# Patient Record
Sex: Female | Born: 1945 | Race: White | Smoking: Never smoker
Health system: Southern US, Community
[De-identification: ages and names within clinical notes are randomized; demographics above are authoritative.]

## PROBLEM LIST (undated history)

## (undated) DIAGNOSIS — J45909 Unspecified asthma, uncomplicated: Secondary | ICD-10-CM

## (undated) DIAGNOSIS — F32A Depression, unspecified: Secondary | ICD-10-CM

## (undated) DIAGNOSIS — M1990 Unspecified osteoarthritis, unspecified site: Secondary | ICD-10-CM

## (undated) DIAGNOSIS — E7800 Pure hypercholesterolemia, unspecified: Secondary | ICD-10-CM

## (undated) DIAGNOSIS — F329 Major depressive disorder, single episode, unspecified: Secondary | ICD-10-CM

## (undated) HISTORY — PX: CHOLECYSTECTOMY: SHX55

---

## 2015-03-27 ENCOUNTER — Emergency Department: Payer: Medicare Other

## 2015-03-27 ENCOUNTER — Emergency Department
Admission: EM | Admit: 2015-03-27 | Discharge: 2015-03-27 | Disposition: A | Discharge status: Home / Self Care | Payer: Medicare Other | Attending: Emergency Medicine | Admitting: Emergency Medicine

## 2015-03-27 DIAGNOSIS — J4521 Mild intermittent asthma with (acute) exacerbation: Secondary | ICD-10-CM | POA: Insufficient documentation

## 2015-03-27 DIAGNOSIS — R0602 Shortness of breath: Secondary | ICD-10-CM | POA: Diagnosis present

## 2015-03-27 MED ORDER — ALBUTEROL SULFATE HFA 108 (90 BASE) MCG/ACT IN AERS: 2.0000 | INHALATION_SPRAY | RESPIRATORY_TRACT | Status: DC | PRN

## 2015-03-27 MED ORDER — IPRATROPIUM-ALBUTEROL 0.5-2.5 (3) MG/3ML IN SOLN
3.0000 mL | Freq: Once | RESPIRATORY_TRACT | Status: AC
  Administered 2015-03-27: 3 mL via RESPIRATORY_TRACT

## 2015-03-27 MED ORDER — PREDNISONE 10 MG PO TABS: 10.0000 mg | ORAL_TABLET | Freq: Two times a day (BID) | ORAL | Status: DC

## 2015-03-27 MED ORDER — DEXAMETHASONE SODIUM PHOSPHATE 10 MG/ML IJ SOLN
10.0000 mg | Freq: Once | INTRAMUSCULAR | Status: AC
  Administered 2015-03-27: 10 mg via INTRAMUSCULAR
  Filled 2015-03-27: qty 1

## 2015-03-27 MED ORDER — IPRATROPIUM-ALBUTEROL 0.5-2.5 (3) MG/3ML IN SOLN
3.0000 mL | Freq: Once | RESPIRATORY_TRACT | Status: AC
Start: 2015-03-27 — End: 2015-03-27
  Administered 2015-03-27: 3 mL via RESPIRATORY_TRACT

## 2015-03-27 MED ORDER — IPRATROPIUM-ALBUTEROL 0.5-2.5 (3) MG/3ML IN SOLN
RESPIRATORY_TRACT | Status: AC
  Administered 2015-03-27: 3 mL via RESPIRATORY_TRACT
  Filled 2015-03-27: qty 3

## 2015-03-27 NOTE — ED Notes (Signed)
Patient presents to ED with son from home with C/O ShOB with hx of asthma. Patient states she used singulair and albuterol at home with no resolution of symptoms.

## 2015-03-27 NOTE — Discharge Instructions (Signed)
Asthma, Acute Bronchospasm °Acute bronchospasm caused by asthma is also referred to as an asthma attack. Bronchospasm means your air passages become narrowed. The narrowing is caused by inflammation and tightening of the muscles in the air tubes (bronchi) in your lungs. This can make it hard to breathe or cause you to wheeze and cough. °CAUSES °Possible triggers are: °· Animal dander from the skin, hair, or feathers of animals. °· Dust mites contained in house dust. °· Cockroaches. °· Pollen from trees or grass. °· Mold. °· Cigarette or tobacco smoke. °· Air pollutants such as dust, household cleaners, hair sprays, aerosol sprays, paint fumes, strong chemicals, or strong odors. °· Cold air or weather changes. Cold air may trigger inflammation. Winds increase molds and pollens in the air. °· Strong emotions such as crying or laughing hard. °· Stress. °· Certain medicines such as aspirin or beta-blockers. °· Sulfites in foods and drinks, such as dried fruits and wine. °· Infections or inflammatory conditions, such as a flu, cold, or inflammation of the nasal membranes (rhinitis). °· Gastroesophageal reflux disease (GERD). GERD is a condition where stomach acid backs up into your esophagus. °· Exercise or strenuous activity. °SIGNS AND SYMPTOMS  °· Wheezing. °· Excessive coughing, particularly at night. °· Chest tightness. °· Shortness of breath. °DIAGNOSIS  °Your health care provider will ask you about your medical history and perform a physical exam. A chest X-ray or blood testing may be performed to look for other causes of your symptoms or other conditions that may have triggered your asthma attack.  °TREATMENT  °Treatment is aimed at reducing inflammation and opening up the airways in your lungs.  Most asthma attacks are treated with inhaled medicines. These include quick relief or rescue medicines (such as bronchodilators) and controller medicines (such as inhaled corticosteroids). These medicines are sometimes  given through an inhaler or a nebulizer. Systemic steroid medicine taken by mouth or given through an IV tube also can be used to reduce the inflammation when an attack is moderate or severe. Antibiotic medicines are only used if a bacterial infection is present.  °HOME CARE INSTRUCTIONS  °· Rest. °· Drink plenty of liquids. This helps the mucus to remain thin and be easily coughed up. Only use caffeine in moderation and do not use alcohol until you have recovered from your illness. °· Do not smoke. Avoid being exposed to secondhand smoke. °· You play a critical role in keeping yourself in good health. Avoid exposure to things that cause you to wheeze or to have breathing problems. °· Keep your medicines up-to-date and available. Carefully follow your health care provider's treatment plan. °· Take your medicine exactly as prescribed. °· When pollen or pollution is bad, keep windows closed and use an air conditioner or go to places with air conditioning. °· Asthma requires careful medical care. See your health care provider for a follow-up as advised. If you are more than [redacted] weeks pregnant and you were prescribed any new medicines, let your obstetrician know about the visit and how you are doing. Follow up with your health care provider as directed. °· After you have recovered from your asthma attack, make an appointment with your outpatient doctor to talk about ways to reduce the likelihood of future attacks. If you do not have a doctor who manages your asthma, make an appointment with a primary care doctor to discuss your asthma. °SEEK IMMEDIATE MEDICAL CARE IF:  °· You are getting worse. °· You have trouble breathing. If severe, call your local   emergency services (911 in the [place]  You develop chest pain or discomfort.  You are vomiting.  You are not able to keep fluids down.  You are coughing up yellow, green, brown, or bloody sputum.  You have a fever and your symptoms suddenly get worse.  You have  trouble swallowing. MAKE SURE YOU:   Understand these instructions.  Will watch your condition.  Will get help right away if you are not doing well or get worse. Document Released: 11/28/2006 Document Revised: 08/18/2013 Document Reviewed: 02/18/2013 [facility] Patient Information 2015 [place], [place]. This information is not intended to replace advice given to you by your health care provider. Make sure you discuss any questions you have with your health care provider.  Continue your home meds for asthma.  Take the prednisone as directed.  Increase fluid intake and avoid the humidity. Use your rescue inhaler every 15 minutes for acute chest tightness, shortness of breath, and wheezing.

## 2015-03-27 NOTE — ED Notes (Signed)
Pt reports receiving nebulizer in triage but medication not marked off by medical staff. After confirming with staff, pt did receive duoneb while in triage.

## 2015-03-27 NOTE — ED Provider Notes (Signed)
[facility] Emergency Department Provider Note ____________________________________________  Time seen: 2255  I have reviewed the triage vital signs and the nursing notes.  HISTORY  Chief Complaint  Asthma  HPI Veronica Wright is a 69 y.o. female reports to the ED for symptoms related to shortness of breath today. She describes she was at home doing laundry and other household chores, when she began to feel slightly short of breath with some associated chest tightness. She used her Singulair inhaler, and then about an hour later used her albuterol inhaler. Her symptoms continue, but she did not re-dose her albuterol inhaler. She said called her adult son who has presented her to the ED for treatment of her symptoms. She denies any significant cough or congestion, denies any chest pain palpitations or productive cough.  No past medical history on file.  There are no active problems to display for this patient.  No past surgical history on file.  Current Outpatient Rx  Name  Route  Sig  Dispense  Refill  . albuterol (PROVENTIL HFA;VENTOLIN HFA) 108 (90 BASE) MCG/ACT inhaler   Inhalation   Inhale 2 puffs into the lungs every 15 (fifteen) minutes as needed for wheezing or shortness of breath.   1 Inhaler   0   . predniSONE (DELTASONE) 10 MG tablet   Oral   Take 1 tablet (10 mg total) by mouth 2 (two) times daily with a meal.   10 tablet   0    Allergies Review of patient's allergies indicates not on file.  No family history on file.  Social History History  Substance Use Topics  . Smoking status: Not on file  . Smokeless tobacco: Not on file  . Alcohol Use: Not on file   Review of Systems  Constitutional: Negative for fever. Eyes: Negative for visual changes. ENT: Negative for sore throat. Cardiovascular: Negative for chest pain. Respiratory: Positive for shortness of breath. Gastrointestinal: Negative for abdominal pain, vomiting and  diarrhea. Genitourinary: Negative for dysuria. Musculoskeletal: Negative for back pain. Skin: Negative for rash. Neurological: Negative for headaches, focal weakness or numbness. ____________________________________________  PHYSICAL EXAM:  VITAL SIGNS: ED Triage Vitals  Enc Vitals Group     BP 03/27/15 1720 144/76 mmHg     Pulse Rate 03/27/15 1720 55     Resp 03/27/15 1720 20     Temp 03/27/15 1720 97.8 F (36.6 C)     Temp Source 03/27/15 1720 Oral     SpO2 03/27/15 1720 100 %     Weight 03/27/15 1720 180 lb (81.647 kg)     Height 03/27/15 1720 5' (1.524 m)     Head Cir --      Peak Flow --      Pain Score --      Pain Loc --      Pain Edu? --      Excl. in [place]? --    Constitutional: Alert and oriented. Well appearing and in no distress. Vital signs reviewed, saturating at 100% on room air. Eyes: Conjunctivae are normal. PERRL. Normal extraocular movements. ENT   Head: Normocephalic and atraumatic.   Nose: No congestion/rhinnorhea.   Mouth/Throat: Mucous membranes are moist.   Neck: Supple. No thyromegaly. Hematological/Lymphatic/Immunilogical: No cervical lymphadenopathy. Cardiovascular: Normal rate, regular rhythm.  Respiratory: Normal respiratory effort. Mild end-expiratory wheezes noted in the bases. No rales/rhonchi. Gastrointestinal: Soft and nontender. No distention. Musculoskeletal: Nontender with normal range of motion in all extremities.  Neurologic:  Normal gait without ataxia.  Normal speech and language. No gross focal neurologic deficits are appreciated. Skin:  Skin is warm, dry and intact. No rash noted. Psychiatric: Mood and affect are normal. Patient exhibits appropriate insight and judgment. ____________________________________________   RADIOLOGY CXR Negative  I, Delroy Ordway, Dannielle Karvonen, personally viewed and evaluated these images as part of my medical decision making.   ____________________________________________  PROCEDURES  DuoNeb x 2 Decadron 10 mg IM ____________________________________________  INITIAL IMPRESSION / ASSESSMENT AND PLAN / ED COURSE  Patient reports improvement of shortness of breath after second breathing treatment. Will treat for acute bronchospasm due to asthma flare. Radiology results to patient. Education on the use of rescue inhaler. Patient to continue home meds. Prescription for albuterol and Prednisone 10 mg #10 provided.  Follow-up with primary provider or return as needed.  ____________________________________________  FINAL CLINICAL IMPRESSION(S) / ED DIAGNOSES  Final diagnoses:  Asthma exacerbation attacks, mild intermittent     Melvenia Needles, PA-C 03/27/15 2342  Earleen Newport, MD 03/28/15 [phone]

## 2016-08-23 ENCOUNTER — Other Ambulatory Visit: Payer: Self-pay | Admitting: Physician Assistant

## 2016-08-23 DIAGNOSIS — Z1231 Encounter for screening mammogram for malignant neoplasm of breast: Secondary | ICD-10-CM

## 2016-09-19 ENCOUNTER — Ambulatory Visit: Payer: Medicare Other

## 2016-09-28 ENCOUNTER — Encounter (HOSPITAL_COMMUNITY): Payer: Self-pay

## 2016-09-28 ENCOUNTER — Ambulatory Visit
Admission: RE | Admit: 2016-09-28 | Discharge: 2016-09-28 | Disposition: A | Discharge status: Home / Self Care | Payer: Medicare Other | Source: Ambulatory Visit | Attending: Physician Assistant | Admitting: Physician Assistant

## 2016-09-28 DIAGNOSIS — Z1231 Encounter for screening mammogram for malignant neoplasm of breast: Secondary | ICD-10-CM | POA: Insufficient documentation

## 2016-10-10 ENCOUNTER — Other Ambulatory Visit: Payer: Self-pay | Admitting: *Deleted

## 2016-10-10 ENCOUNTER — Inpatient Hospital Stay
Admission: RE | Admit: 2016-10-10 | Discharge: 2016-10-10 | Disposition: A | Discharge status: Home / Self Care | Payer: Self-pay | Source: Ambulatory Visit | Attending: *Deleted | Admitting: *Deleted

## 2016-10-10 DIAGNOSIS — Z1231 Encounter for screening mammogram for malignant neoplasm of breast: Secondary | ICD-10-CM

## 2016-10-22 ENCOUNTER — Other Ambulatory Visit: Payer: Self-pay | Admitting: Gastroenterology

## 2016-10-22 DIAGNOSIS — R1310 Dysphagia, unspecified: Secondary | ICD-10-CM

## 2016-11-02 ENCOUNTER — Ambulatory Visit: Payer: Medicare Other

## 2016-11-07 ENCOUNTER — Ambulatory Visit
Admission: RE | Admit: 2016-11-07 | Discharge: 2016-11-07 | Disposition: A | Discharge status: Home / Self Care | Payer: Medicare Other | Source: Ambulatory Visit | Attending: Gastroenterology | Admitting: Gastroenterology

## 2016-11-07 DIAGNOSIS — R1310 Dysphagia, unspecified: Secondary | ICD-10-CM | POA: Diagnosis not present

## 2016-12-12 ENCOUNTER — Emergency Department
Admission: EM | Admit: 2016-12-12 | Discharge: 2016-12-12 | Discharge status: Against Medical Advice | Payer: Medicare Other | Attending: Emergency Medicine | Admitting: Emergency Medicine

## 2016-12-12 ENCOUNTER — Emergency Department: Payer: Medicare Other

## 2016-12-12 ENCOUNTER — Encounter: Payer: Self-pay | Admitting: *Deleted

## 2016-12-12 DIAGNOSIS — Z79899 Other long term (current) drug therapy: Secondary | ICD-10-CM | POA: Diagnosis not present

## 2016-12-12 DIAGNOSIS — M5416 Radiculopathy, lumbar region: Secondary | ICD-10-CM | POA: Diagnosis not present

## 2016-12-12 DIAGNOSIS — J45909 Unspecified asthma, uncomplicated: Secondary | ICD-10-CM | POA: Insufficient documentation

## 2016-12-12 DIAGNOSIS — M545 Low back pain: Secondary | ICD-10-CM | POA: Diagnosis present

## 2016-12-12 HISTORY — DX: Unspecified asthma, uncomplicated: J45.909

## 2016-12-12 LAB — URINALYSIS, COMPLETE (UACMP) WITH MICROSCOPIC
BACTERIA UA: NONE SEEN
BILIRUBIN URINE: NEGATIVE
Glucose, UA: NEGATIVE mg/dL
Hgb urine dipstick: NEGATIVE
Ketones, ur: NEGATIVE mg/dL
Nitrite: NEGATIVE
Protein, ur: 30 mg/dL — AB
Specific Gravity, Urine: 1.024 (ref 1.005–1.030)
pH: 5 (ref 5.0–8.0)

## 2016-12-12 MED ORDER — FENTANYL CITRATE (PF) 100 MCG/2ML IJ SOLN
50.0000 ug | Freq: Once | INTRAMUSCULAR | Status: AC
  Administered 2016-12-12: 50 ug via INTRAVENOUS
  Filled 2016-12-12: qty 2

## 2016-12-12 MED ORDER — ORPHENADRINE CITRATE 30 MG/ML IJ SOLN
60.0000 mg | Freq: Once | INTRAMUSCULAR | Status: AC
  Administered 2016-12-12: 60 mg via INTRAVENOUS
  Filled 2016-12-12: qty 2

## 2016-12-12 MED ORDER — DEXAMETHASONE SODIUM PHOSPHATE 10 MG/ML IJ SOLN
10.0000 mg | Freq: Once | INTRAMUSCULAR | Status: AC
  Administered 2016-12-12: 10 mg via INTRAMUSCULAR
  Filled 2016-12-12: qty 1

## 2016-12-12 NOTE — ED Provider Notes (Signed)
[facility] Emergency Department Provider Note ____________________________________________  Time seen: Approximately 4:57 PM  I have reviewed the triage vital signs and the nursing notes.   HISTORY  Chief Complaint Back Pain    HPI Veronica Wright is a 71 y.o. female who presents to the emergency department for evaluation of lower back pain for the past month. She was evaluated by "Dr. Ouida Sills" at [facility] and was advised she has a "slipped disc." She is going through physical therapy. Her pain is much worse today and is now in the left anterior leg. No relief with muscle relaxer. She denies loss of bowel or bladder function.   Past Medical History:  Diagnosis Date  . Asthma     There are no active problems to display for this patient.   History reviewed. No pertinent surgical history.  Prior to Admission medications   Medication Sig Start Date End Date Taking? Authorizing Provider  albuterol (PROVENTIL HFA;VENTOLIN HFA) 108 (90 BASE) MCG/ACT inhaler Inhale 2 puffs into the lungs every 15 (fifteen) minutes as needed for wheezing or shortness of breath. 03/27/15   Jenise V Bacon Menshew, PA-C  predniSONE (DELTASONE) 10 MG tablet Take 1 tablet (10 mg total) by mouth 2 (two) times daily with a meal. 03/27/15   Jenise V Bacon Menshew, PA-C    Allergies Patient has no known allergies.  Family History  Problem Relation Age of Onset  . Breast cancer Sister 84    Social History Social History  Substance Use Topics  . Smoking status: Never Smoker  . Smokeless tobacco: Never Used  . Alcohol use No    Review of Systems Constitutional: No recent illness. Cardiovascular: Denies chest pain or palpitations. Respiratory: Denies shortness of breath. Musculoskeletal: Pain in left lower back. Skin: Negative for rash, wound, lesion. Neurological: Negative for focal weakness or numbness.  ____________________________________________   PHYSICAL  EXAM:  VITAL SIGNS: ED Triage Vitals  Enc Vitals Group     BP 12/12/16 1649 137/73     Pulse Rate 12/12/16 1649 (!) 56     Resp 12/12/16 1649 18     Temp 12/12/16 1649 98.2 F (36.8 C)     Temp Source 12/12/16 1649 Oral     SpO2 12/12/16 1649 98 %     Weight 12/12/16 1647 190 lb (86.2 kg)     Height 12/12/16 1647 5' (1.524 m)     Head Circumference --      Peak Flow --      Pain Score 12/12/16 1647 10     Pain Loc --      Pain Edu? --      Excl. in [place]? --     Constitutional: Alert and oriented. Well appearing and in no acute distress. Eyes: Conjunctivae are normal. EOMI. Head: Atraumatic. Neck: No stridor.  Respiratory: Normal respiratory effort.   Musculoskeletal: Antalgic gait; No focal bony tenderness or stepoff over the lumbar spine; exam limited due to patient's inability lie or sit due to pain.  Neurologic:  Normal speech and language. No gross focal neurologic deficits are appreciated. Speech is normal. No gait instability. Skin:  Skin is warm, dry and intact. Atraumatic. Psychiatric: Mood and affect are normal. Speech and behavior are normal.  ____________________________________________   LABS (all labs ordered are listed, but only abnormal results are displayed)  Labs Reviewed  URINALYSIS, COMPLETE (UACMP) WITH MICROSCOPIC - Abnormal; Notable for the following:       Result Value   Color, Urine  YELLOW (*)    APPearance HAZY (*)    Protein, ur 30 (*)    Leukocytes, UA LARGE (*)    Squamous Epithelial / LPF 0-5 (*)    Non Squamous Epithelial 0-5 (*)    All other components within normal limits   ____________________________________________  RADIOLOGY  Patient refused MR of Lumbar spine. ____________________________________________   PROCEDURES  Procedure(s) performed: None  ____________________________________________   INITIAL IMPRESSION / ASSESSMENT AND PLAN / ED COURSE  71 year old female presenting to the emergency department for evaluation  of lower back and left leg pain. Pain has been gradually worsening over the past 24 hours without new injury. She reports that muscle relaxers have not worked in the past, but I would like to try an injection of Norflex and reassess. We also discussed MRI. Patient states that she is claustrophobic. She was advised that I will order antianxiety medications prior to testing and that she would most likely go in feet first.    ---------------------------------------- 6:37 PM on 12/12/2016 ----------------------------------------  Awaiting MR technician to arrive. Patient states that her leg pain has improved, but her back pain is unchanged after the Norflex and decadron. Fentanyl ordered.   ----------------------------------------- 7:58 PM on 12/12/2016 -----------------------------------------  After the patient went to MRI, she refused to have the test completed. She was offered anti-anxiety medications, but continued to refuse. The testing was to be completed feetfirst verses headfirst and the patient was aware of that, yet continued to refuse. She will leave [facility]. She was warned that her condition may worsen and lead to long-term medical complications and potentially death. She was strongly advised to call her orthopedist tomorrow. She was advised to return to the emergency department for symptoms that change or worsen if she is unable to see the orthopedist.  Pertinent labs & imaging results that were available during my care of the patient were reviewed by me and considered in my medical decision making (see chart for details).  _________________________________________   FINAL CLINICAL IMPRESSION(S) / ED DIAGNOSES  Final diagnoses:  Lumbar radiculopathy, acute    Discharge Medication List as of 12/12/2016  7:57 PM      If controlled substance prescribed during this visit, 12 month history viewed on the [employer] prior to issuing an initial prescription for Schedule II  or III opiod.    Victorino Dike, FNP 12/12/16 2055    Nance Pear, MD 12/12/16 2220

## 2016-12-12 NOTE — ED Triage Notes (Signed)
States she was diagnosed with a slipped disc in her lower back 1 month ago, states she has been going to PT but states increased back pain for the past day or so, ambulatory, denies any new injury

## 2016-12-12 NOTE — ED Notes (Signed)
Pt signed out ama.  Pt refused mri tonight secondary to claustrophobia and also refused anxiety meds.  Iv removed   Discharge papers given to pt.  Pt out via wheelchair.  Pt alert  Speech clear.

## 2016-12-12 NOTE — Discharge Instructions (Signed)
You are leaving against medical advice and it is your responsibility to make this decision knowing that your symptoms may worsen and lead to long term complications and even death in some cases.  I strongly recommend following up with your orthopedist as soon as possible.  Return to the ER for symptoms that change or worsen if you are unable to schedule an appointment.

## 2016-12-12 NOTE — ED Notes (Signed)
Decadron and Noriflex given IM.  [place] FNP aware.  Iv started and meds given iv. [name]

## 2016-12-12 NOTE — ED Notes (Signed)
Pt reports she has a slipped disc in her lower back/  Pt had PT on Monday and now has increased pain in lower back and left leg.  No recent injury to back  No urinary sx.  Pt alert   Speech clear.

## 2016-12-13 ENCOUNTER — Other Ambulatory Visit: Payer: Self-pay | Admitting: Physician Assistant

## 2016-12-13 DIAGNOSIS — S39012A Strain of muscle, fascia and tendon of lower back, initial encounter: Secondary | ICD-10-CM

## 2016-12-14 ENCOUNTER — Ambulatory Visit
Admission: RE | Admit: 2016-12-14 | Discharge: 2016-12-14 | Disposition: A | Discharge status: Home / Self Care | Payer: Medicare Other | Source: Ambulatory Visit | Attending: Physician Assistant | Admitting: Physician Assistant

## 2016-12-14 DIAGNOSIS — M5137 Other intervertebral disc degeneration, lumbosacral region: Secondary | ICD-10-CM | POA: Insufficient documentation

## 2016-12-14 DIAGNOSIS — M47816 Spondylosis without myelopathy or radiculopathy, lumbar region: Secondary | ICD-10-CM | POA: Insufficient documentation

## 2016-12-14 DIAGNOSIS — M5136 Other intervertebral disc degeneration, lumbar region: Secondary | ICD-10-CM | POA: Insufficient documentation

## 2016-12-14 DIAGNOSIS — M5416 Radiculopathy, lumbar region: Secondary | ICD-10-CM | POA: Diagnosis present

## 2016-12-14 DIAGNOSIS — S39012A Strain of muscle, fascia and tendon of lower back, initial encounter: Secondary | ICD-10-CM

## 2017-02-11 ENCOUNTER — Encounter: Payer: Self-pay | Admitting: *Deleted

## 2017-02-12 ENCOUNTER — Encounter
Admission: RE | Disposition: A | Discharge status: Home / Self Care | Payer: Self-pay | Source: Ambulatory Visit | Attending: Gastroenterology

## 2017-02-12 ENCOUNTER — Ambulatory Visit: Payer: Medicare Other | Admitting: Certified Registered Nurse Anesthetist

## 2017-02-12 ENCOUNTER — Ambulatory Visit
Admission: RE | Admit: 2017-02-12 | Discharge: 2017-02-12 | Disposition: A | Discharge status: Home / Self Care | Payer: Medicare Other | Source: Ambulatory Visit | Attending: Gastroenterology | Admitting: Gastroenterology

## 2017-02-12 ENCOUNTER — Encounter: Payer: Self-pay | Admitting: *Deleted

## 2017-02-12 DIAGNOSIS — J45909 Unspecified asthma, uncomplicated: Secondary | ICD-10-CM | POA: Diagnosis not present

## 2017-02-12 DIAGNOSIS — K219 Gastro-esophageal reflux disease without esophagitis: Secondary | ICD-10-CM | POA: Insufficient documentation

## 2017-02-12 DIAGNOSIS — R7303 Prediabetes: Secondary | ICD-10-CM | POA: Insufficient documentation

## 2017-02-12 DIAGNOSIS — F329 Major depressive disorder, single episode, unspecified: Secondary | ICD-10-CM | POA: Insufficient documentation

## 2017-02-12 DIAGNOSIS — R1310 Dysphagia, unspecified: Secondary | ICD-10-CM | POA: Diagnosis not present

## 2017-02-12 DIAGNOSIS — D122 Benign neoplasm of ascending colon: Secondary | ICD-10-CM | POA: Diagnosis not present

## 2017-02-12 DIAGNOSIS — Z1211 Encounter for screening for malignant neoplasm of colon: Secondary | ICD-10-CM | POA: Insufficient documentation

## 2017-02-12 DIAGNOSIS — Z79899 Other long term (current) drug therapy: Secondary | ICD-10-CM | POA: Insufficient documentation

## 2017-02-12 DIAGNOSIS — K5750 Diverticulosis of both small and large intestine without perforation or abscess without bleeding: Secondary | ICD-10-CM | POA: Diagnosis not present

## 2017-02-12 DIAGNOSIS — D120 Benign neoplasm of cecum: Secondary | ICD-10-CM | POA: Insufficient documentation

## 2017-02-12 DIAGNOSIS — K3189 Other diseases of stomach and duodenum: Secondary | ICD-10-CM | POA: Insufficient documentation

## 2017-02-12 DIAGNOSIS — R21 Rash and other nonspecific skin eruption: Secondary | ICD-10-CM | POA: Insufficient documentation

## 2017-02-12 DIAGNOSIS — Z880 Allergy status to penicillin: Secondary | ICD-10-CM | POA: Diagnosis not present

## 2017-02-12 DIAGNOSIS — M1990 Unspecified osteoarthritis, unspecified site: Secondary | ICD-10-CM | POA: Insufficient documentation

## 2017-02-12 DIAGNOSIS — E7800 Pure hypercholesterolemia, unspecified: Secondary | ICD-10-CM | POA: Insufficient documentation

## 2017-02-12 HISTORY — DX: Pure hypercholesterolemia, unspecified: E78.00

## 2017-02-12 HISTORY — DX: Major depressive disorder, single episode, unspecified: F32.9

## 2017-02-12 HISTORY — PX: COLONOSCOPY WITH PROPOFOL: SHX5780

## 2017-02-12 HISTORY — DX: Unspecified osteoarthritis, unspecified site: M19.90

## 2017-02-12 HISTORY — PX: ESOPHAGOGASTRODUODENOSCOPY (EGD) WITH PROPOFOL: SHX5813

## 2017-02-12 HISTORY — DX: Depression, unspecified: F32.A

## 2017-02-12 SURGERY — COLONOSCOPY WITH PROPOFOL
Anesthesia: General

## 2017-02-12 MED ORDER — FENTANYL CITRATE (PF) 100 MCG/2ML IJ SOLN
INTRAMUSCULAR | Status: DC | PRN
  Administered 2017-02-12: 50 ug via INTRAVENOUS

## 2017-02-12 MED ORDER — GLYCOPYRROLATE 0.2 MG/ML IJ SOLN
INTRAMUSCULAR | Status: DC | PRN
  Administered 2017-02-12: 0.2 mg via INTRAVENOUS

## 2017-02-12 MED ORDER — PROPOFOL 500 MG/50ML IV EMUL
INTRAVENOUS | Status: AC
  Filled 2017-02-12: qty 50

## 2017-02-12 MED ORDER — LIDOCAINE HCL (CARDIAC) 20 MG/ML IV SOLN
INTRAVENOUS | Status: DC | PRN
  Administered 2017-02-12: 100 mg via INTRAVENOUS

## 2017-02-12 MED ORDER — LIDOCAINE HCL (PF) 2 % IJ SOLN
INTRAMUSCULAR | Status: AC
  Filled 2017-02-12: qty 2

## 2017-02-12 MED ORDER — PROPOFOL 10 MG/ML IV BOLUS
INTRAVENOUS | Status: DC | PRN
  Administered 2017-02-12: 25 mg via INTRAVENOUS
  Administered 2017-02-12: 8.5 mg via INTRAVENOUS
  Administered 2017-02-12: 17 mg via INTRAVENOUS
  Administered 2017-02-12: 25 mg via INTRAVENOUS
  Administered 2017-02-12 (×2): 8.5 mg via INTRAVENOUS

## 2017-02-12 MED ORDER — SODIUM CHLORIDE 0.9 % IV SOLN
INTRAVENOUS | Status: DC
  Administered 2017-02-12: 1000 mL via INTRAVENOUS

## 2017-02-12 MED ORDER — PROPOFOL 500 MG/50ML IV EMUL
INTRAVENOUS | Status: DC | PRN
  Administered 2017-02-12: 120 ug/kg/min via INTRAVENOUS

## 2017-02-12 NOTE — Op Note (Signed)
[facility] Gastroenterology Patient Name: Veronica Wright Procedure Date: 02/12/2017 1:12 PM MRN: [id number] Account #: [id number] Date of Birth: July 19, 1946 Admit Type: Outpatient Age: 71 Room: [facility] ENDO ROOM 3 Gender: Female Note Status: Finalized Procedure:            Colonoscopy Indications:          Screening for colorectal malignant neoplasm Providers:            Lollie Sails, MD Referring MD:         No Local Md, MD (Referring MD) Medicines:            Monitored Anesthesia Care Complications:        No immediate complications. Procedure:            Pre-Anesthesia Assessment:                       - ASA Grade Assessment: III - A patient with severe                        systemic disease.                       After obtaining informed consent, the colonoscope was                        passed under direct vision. Throughout the procedure,                        the patient's blood pressure, pulse, and oxygen                        saturations were monitored continuously. The                        Colonoscope was introduced through the anus and                        advanced to the the cecum, identified by appendiceal                        orifice and ileocecal valve. The patient tolerated the                        procedure well. The colonoscopy was performed with                        moderate difficulty due to poor bowel prep. Successful                        completion of the procedure was aided by lavage. The                        quality of the bowel preparation was fair. Findings:      A 3 mm polyp was found in the cecum. The polyp was flat. The polyp was       removed with a cold biopsy forceps. Resection and retrieval were       complete.      A 2 mm polyp was found in the proximal ascending colon. The polyp was       sessile. The polyp  was removed with a cold biopsy forceps. Resection and       retrieval were complete.      A 10 mm  polyp was found in the distal ascending colon. The polyp was       sessile.      Multiple small to medium diverticula were found in the sigmoid colon and       descending colon.      The digital rectal exam was normal.      The perianal exam findings include a perianal rash. Impression:           - Preparation of the colon was fair.                       - One 3 mm polyp in the cecum, removed with a cold                        biopsy forceps. Resected and retrieved.                       - One 2 mm polyp in the proximal ascending colon,                        removed with a cold biopsy forceps. Resected and                        retrieved.                       - One 10 mm polyp in the distal ascending colon.                       - Diverticulosis in the sigmoid colon and in the                        descending colon.                       - Perianal rash found on perianal exam. Recommendation:       - Discharge patient to home.                       - desitin cream to perianal skin tid for 5-7 days. Procedure Code(s):    --- Professional ---                       [phone], Colonoscopy, flexible; with biopsy, single or                        multiple Diagnosis Code(s):    --- Professional ---                       Z12.11, Encounter for screening for malignant neoplasm                        of colon                       D12.0, Benign neoplasm of cecum                       D12.2, Benign neoplasm of ascending colon  R21, Rash and other nonspecific skin eruption                       K57.30, Diverticulosis of large intestine without                        perforation or abscess without bleeding CPT copyright 2016 [employer]. All rights reserved. The codes documented in this report are preliminary and upon coder review may  be revised to meet current compliance requirements. Lollie Sails, MD 02/12/2017 2:31:18 PM This report has been signed  electronically. Number of Addenda: 0 Note Initiated On: 02/12/2017 1:12 PM Scope Withdrawal Time: 0 hours 19 minutes 52 seconds  Total Procedure Duration: 0 hours 39 minutes 22 seconds       [facility]

## 2017-02-12 NOTE — Op Note (Addendum)
[facility] Gastroenterology Patient Name: Veronica Wright Procedure Date: 02/12/2017 1:13 PM MRN: [id number] Account #: [id number] Date of Birth: February 02, 1946 Admit Type: Outpatient Age: 70 Room: [facility] ENDO ROOM 3 Gender: Female Note Status: Finalized Procedure:            Upper GI endoscopy Indications:          Dysphagia Providers:            Lollie Sails, MD Referring MD:         No Local Md, MD (Referring MD) Medicines:            Monitored Anesthesia Care Complications:        No immediate complications. Procedure:            Pre-Anesthesia Assessment:                       - ASA Grade Assessment: III - A patient with severe                        systemic disease.                       After obtaining informed consent, the endoscope was                        passed under direct vision. Throughout the procedure,                        the patient's blood pressure, pulse, and oxygen                        saturations were monitored continuously. The Endoscope                        was introduced through the mouth, and advanced to the                        third part of duodenum. The upper GI endoscopy was                        accomplished without difficulty. The patient tolerated                        the procedure well. Findings:      LA Grade A (one or more mucosal breaks less than 5 mm, not extending       between tops of 2 mucosal folds) esophagitis with no bleeding was found.       Biopsies were taken with a cold forceps for histology.      The exam of the esophagus was otherwise normal. No evidence of stenosis       or stricture or othe lesion.      Diffuse and patchy mild inflammation characterized by adherent blood,       congestion (edema) and erythema was found in the gastric body, in the       gastric antrum and on the posterior wall of the gastric antrum. Biopsies       were taken with a cold forceps for histology. Biopsies were taken  with a       cold forceps for Helicobacter pylori testing.      The cardia  and gastric fundus were normal on retroflexion.      A medium non-bleeding diverticulum was found in the second portion of       the duodenum and in the third portion of the duodenum.      Duodenal mucosa appeared grossly normal. Impression:           - LA Grade A erosive esophagitis. Biopsied.                       - Erosive gastritis. Biopsied.                       - Non-bleeding duodenal diverticulum. Recommendation:       - Use Aciphex (rabeprazole) 20 mg PO daily daily.                       - Await pathology results.                       - Return to GI clinic in 4 weeks. Procedure Code(s):    --- Professional ---                       [phone], Esophagogastroduodenoscopy, flexible, transoral;                        with biopsy, single or multiple Diagnosis Code(s):    --- Professional ---                       K20.8, Other esophagitis                       K29.60, Other gastritis without bleeding                       R13.10, Dysphagia, unspecified                       K57.10, Diverticulosis of small intestine without                        perforation or abscess without bleeding CPT copyright 2016 [employer]. All rights reserved. The codes documented in this report are preliminary and upon coder review may  be revised to meet current compliance requirements. Lollie Sails, MD 02/12/2017 1:36:39 PM This report has been signed electronically. Number of Addenda: 0 Note Initiated On: 02/12/2017 1:13 PM      [facility]

## 2017-02-12 NOTE — Transfer of Care (Signed)
Immediate Anesthesia Transfer of Care Note  Patient: Veronica Wright  Procedure(s) Performed: Procedure(s): COLONOSCOPY WITH PROPOFOL (N/A) ESOPHAGOGASTRODUODENOSCOPY (EGD) WITH PROPOFOL (N/A)  Patient Location: PACU  Anesthesia Type:General  Level of Consciousness: drowsy  Airway & Oxygen Therapy: Patient Spontanous Breathing and Patient connected to nasal cannula oxygen  Post-op Assessment: Report given to RN and Post -op Vital signs reviewed and stable  Post vital signs: Reviewed and stable  Last Vitals:  Vitals:   02/12/17 1244  BP: 116/66  Pulse: 63  Resp: 20  Temp: 36.3 C    Last Pain:  Vitals:   02/12/17 1244  TempSrc: Tympanic  PainSc: 4       Patients Stated Pain Goal: 0 (61/48/30 7354)  Complications: No apparent anesthesia complications

## 2017-02-12 NOTE — Anesthesia Preprocedure Evaluation (Signed)
Anesthesia Evaluation  Patient identified by MRN, date of birth, ID band Patient awake    Reviewed: Allergy & Precautions, H&P , NPO status , Patient's Chart, lab work & pertinent test results, reviewed documented beta blocker date and time   History of Anesthesia Complications Negative for: history of anesthetic complications  Airway Mallampati: II  TM Distance: >3 FB Neck ROM: full    Dental no notable dental hx. (+) Upper Dentures, Edentulous Upper, Dental Advidsory Given, Poor Dentition   Pulmonary neg shortness of breath, asthma , neg sleep apnea, neg COPD, neg recent URI,           Cardiovascular Exercise Tolerance: Good negative cardio ROS       Neuro/Psych PSYCHIATRIC DISORDERS (Depression) negative neurological ROS     GI/Hepatic Neg liver ROS, GERD  ,  Endo/Other  diabetes (Borderline)  Renal/GU negative Renal ROS  negative genitourinary   Musculoskeletal   Abdominal   Peds  Hematology negative hematology ROS (+)   Anesthesia Other Findings Past Medical History: No date: Arthritis No date: Asthma No date: Depression No date: High cholesterol   Reproductive/Obstetrics negative OB ROS                             Anesthesia Physical Anesthesia Plan  ASA: II  Anesthesia Plan: General   Post-op Pain Management:    Induction: Intravenous  PONV Risk Score and Plan: 3 and Propofol  Airway Management Planned: Natural Airway and Nasal Cannula  Additional Equipment:   Intra-op Plan:   Post-operative Plan:   Informed Consent: I have reviewed the patients History and Physical, chart, labs and discussed the procedure including the risks, benefits and alternatives for the proposed anesthesia with the patient or authorized representative who has indicated his/her understanding and acceptance.   Dental Advisory Given  Plan Discussed with: Anesthesiologist, CRNA and  Surgeon  Anesthesia Plan Comments:         Anesthesia Quick Evaluation

## 2017-02-12 NOTE — Anesthesia Post-op Follow-up Note (Cosign Needed)
Anesthesia QCDR form completed.        

## 2017-02-12 NOTE — Anesthesia Postprocedure Evaluation (Signed)
Anesthesia Post Note  Patient: Veronica Wright  Procedure(s) Performed: Procedure(s) (LRB): COLONOSCOPY WITH PROPOFOL (N/A) ESOPHAGOGASTRODUODENOSCOPY (EGD) WITH PROPOFOL (N/A)  Patient location during evaluation: Endoscopy Anesthesia Type: General Level of consciousness: awake and alert Pain management: pain level controlled Vital Signs Assessment: post-procedure vital signs reviewed and stable Respiratory status: spontaneous breathing and respiratory function stable Cardiovascular status: stable Anesthetic complications: no     Last Vitals:  Vitals:   02/12/17 1244 02/12/17 1425  BP: 116/66 119/78  Pulse: 63 77  Resp: 20 18  Temp: 36.3 C 36.1 C    Last Pain:  Vitals:   02/12/17 1425  TempSrc: Tympanic  PainSc:                  Chesky Heyer K

## 2017-02-12 NOTE — H&P (Signed)
Outpatient short stay form Pre-procedure 02/12/2017 1:03 PM Lollie Sails MD  Primary Physician: Juliet Rude PA  Reason for visit:  EGD and colonoscopy  History of present illness:  Patient is a 71 year old female presenting today as above. She reports having some dysphagia occasionally over the past couple of years. This mainly to solid foods or things that are more sticky like peanut butter or breads. She has been taking some Nexium 20 mg daily but not recently. She did have a barium swallow on 11/07/2016 this showing a normal study with normal transit of a 13 mm barium tablet. She tolerated her prep well. She takes no aspirin or blood thinning agents.    Current Facility-Administered Medications:  .  0.9 %  sodium chloride infusion, , Intravenous, Continuous, Lollie Sails, MD  Prescriptions Prior to Admission  Medication Sig Dispense Refill Last Dose  . albuterol (PROVENTIL HFA;VENTOLIN HFA) 108 (90 BASE) MCG/ACT inhaler Inhale 2 puffs into the lungs every 15 (fifteen) minutes as needed for wheezing or shortness of breath. 1 Inhaler 0 Past Week at Unknown time  . budesonide-formoterol (SYMBICORT) 160-4.5 MCG/ACT inhaler Inhale 2 puffs into the lungs as needed.   Past Month at Unknown time  . citalopram (CELEXA) 20 MG tablet Take 20 mg by mouth daily.   02/12/2017 at 900  . Fluticasone-Salmeterol (ADVAIR) 250-50 MCG/DOSE AEPB Inhale 1 puff into the lungs as needed.     . triamcinolone (NASACORT) 55 MCG/ACT AERO nasal inhaler Place 2 sprays into the nose as needed.   Past Month at Unknown time  . zolpidem (AMBIEN) 10 MG tablet Take 10 mg by mouth at bedtime.   02/11/2017 at Unknown time  . predniSONE (DELTASONE) 10 MG tablet Take 1 tablet (10 mg total) by mouth 2 (two) times daily with a meal. (Patient not taking: Reported on 02/12/2017) 10 tablet 0 Completed Course at Unknown time     Allergies  Allergen Reactions  . Penicillins      Past Medical History:  Diagnosis Date   . Arthritis   . Asthma   . Depression   . High cholesterol     Review of systems:      Physical Exam    Heart and lungs: Regular rate and rhythm without rub or gallop, lungs are bilaterally clear.    HEENT: Normocephalic atraumatic eyes are anicteric    Other:     Pertinant exam for procedure: Soft nontender nondistended bowel sounds positive normoactive.    Planned proceedures: EGD, colonoscopy and indicated procedures. I have discussed the risks benefits and complications of procedures to include not limited to bleeding, infection, perforation and the risk of sedation and the patient wishes to proceed.    Lollie Sails, MD Gastroenterology 02/12/2017  1:03 PM

## 2017-02-13 ENCOUNTER — Encounter: Payer: Self-pay | Admitting: Gastroenterology

## 2017-02-15 LAB — SURGICAL PATHOLOGY

## 2017-02-17 DIAGNOSIS — K635 Polyp of colon: Secondary | ICD-10-CM | POA: Insufficient documentation

## 2018-12-29 IMAGING — RF DG ESOPHAGUS
7 series · 17 of 21 positions shown · non-contrast
Comparison: None.

CLINICAL DATA: Tightness in the esophagus for 2 months

EXAM:
ESOPHOGRAM / BARIUM SWALLOW / BARIUM TABLET STUDY
TECHNIQUE: Combined double contrast and single contrast examination performed
using effervescent crystals, thick barium liquid, and thin barium
liquid. The patient was observed with fluoroscopy swallowing a 13 mm
barium sulphate tablet.
FLUOROSCOPY TIME:  Fluoroscopy Time:  48 seconds
Radiation Exposure Index (if provided by the fluoroscopic device):
9.4 mGy
Number of Acquired Spot Images: 0

[Series 1: cp_standard · 0.51mm/px · 3 of 46 frames shown (1 of 7)]
[frame 7/46]
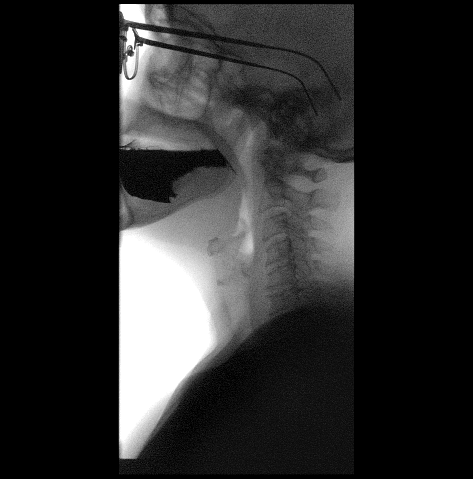
[frame 20/46]
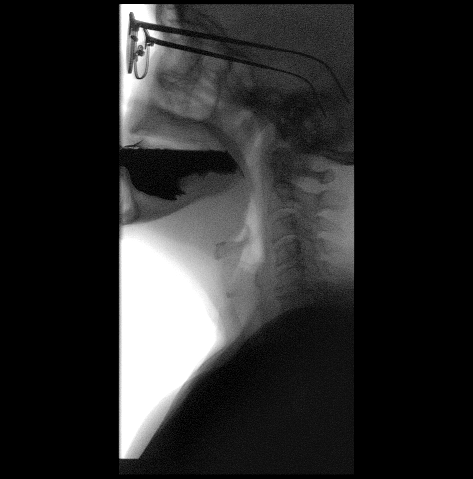
[frame 40/46]
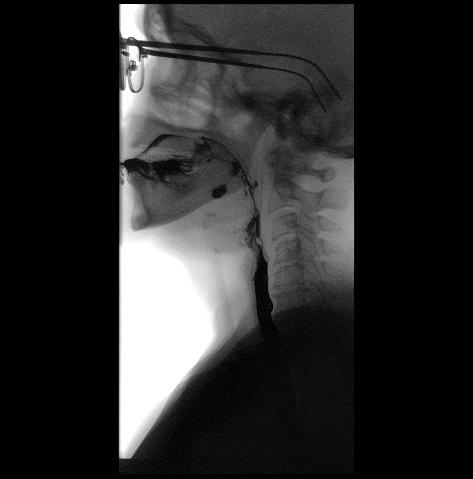

[Series 2: cp_standard · 0.51mm/px · 3 of 95 frames shown (2 of 7)]
[frame 15/95]
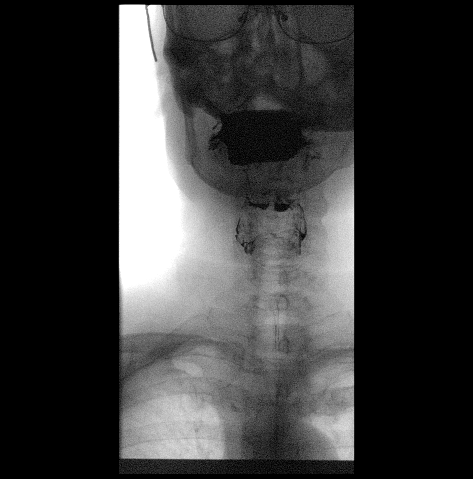
[frame 48/95]
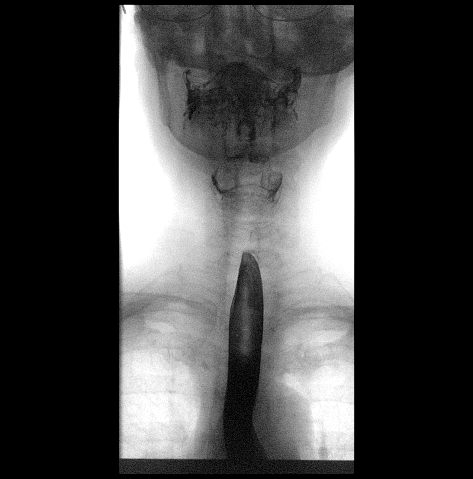
[frame 81/95]
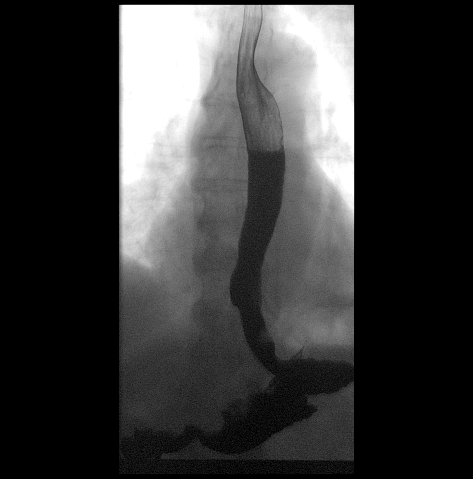

[Series 3: cp_standard · 0.52mm/px · 3 of 110 frames shown (3 of 7)]
[frame 17/110]
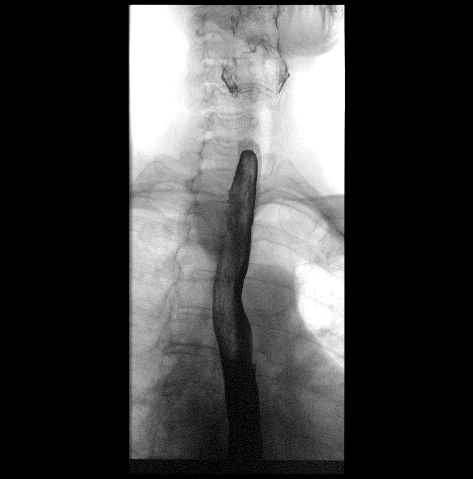
[frame 56/110]
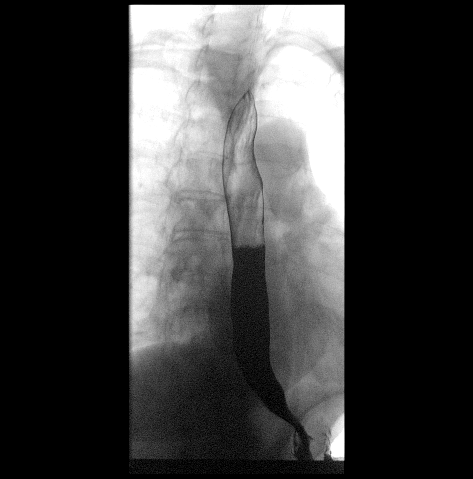
[frame 94/110]
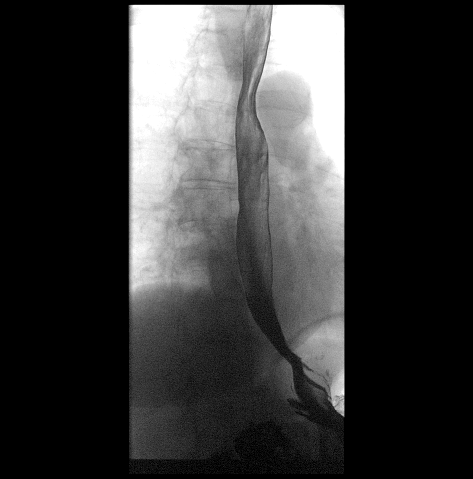

[Series 4: cp_standard · 0.52mm/px · 3 of 60 frames shown (4 of 7)]
[frame 1/60]
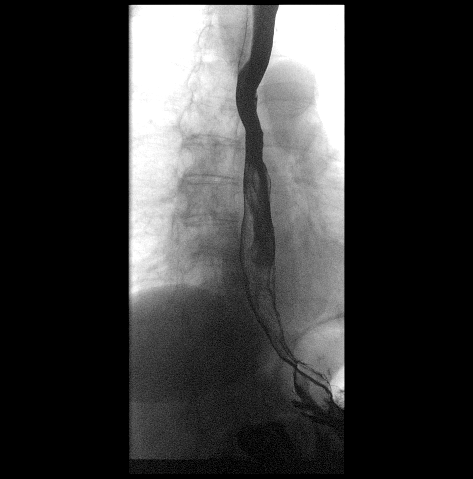
[frame 10/60]
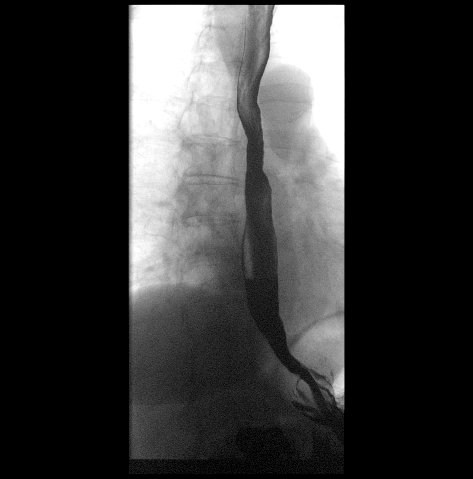
[frame 52/60]
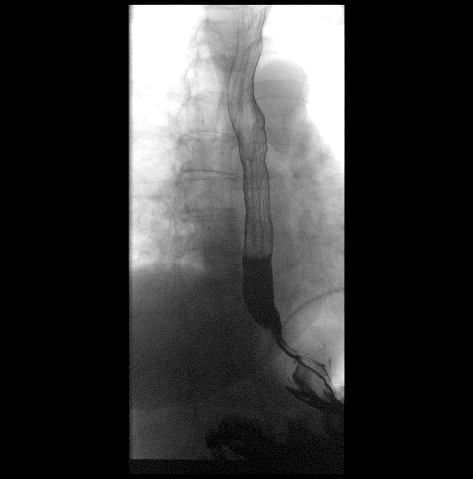

[Series 5: cp_standard · 0.52mm/px · 3 of 7 frames shown (5 of 7)]
[frame 1/7]
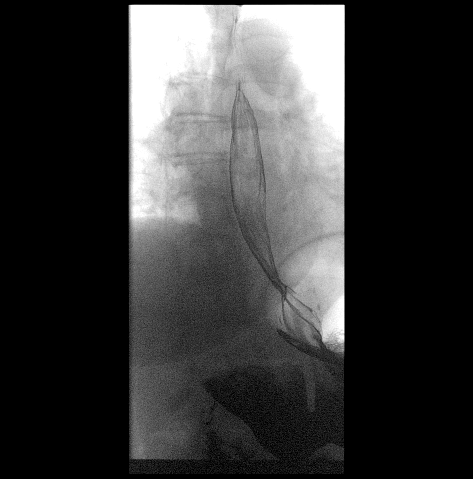
[frame 2/7]
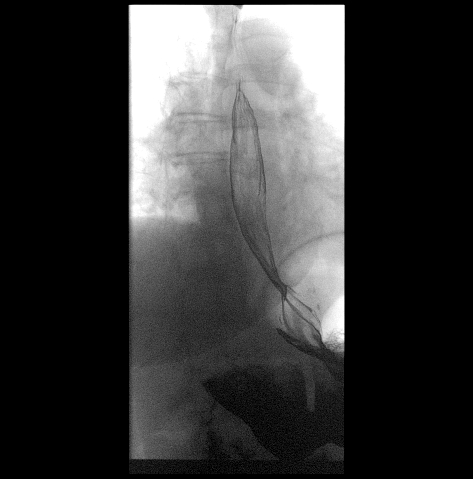
[frame 4/7]
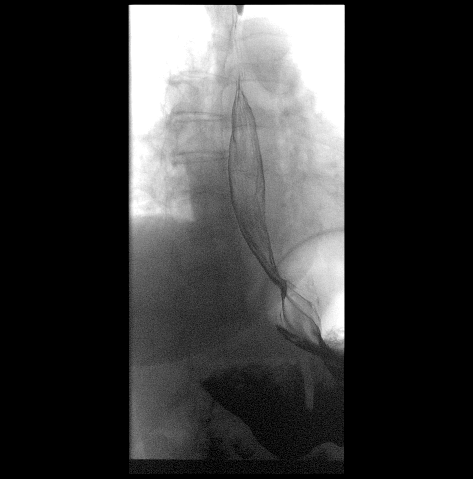

[Series 6: cp_standard · 0.27mm/px · 1 of 1 slices shown (6 of 7)]
[im 1/1]
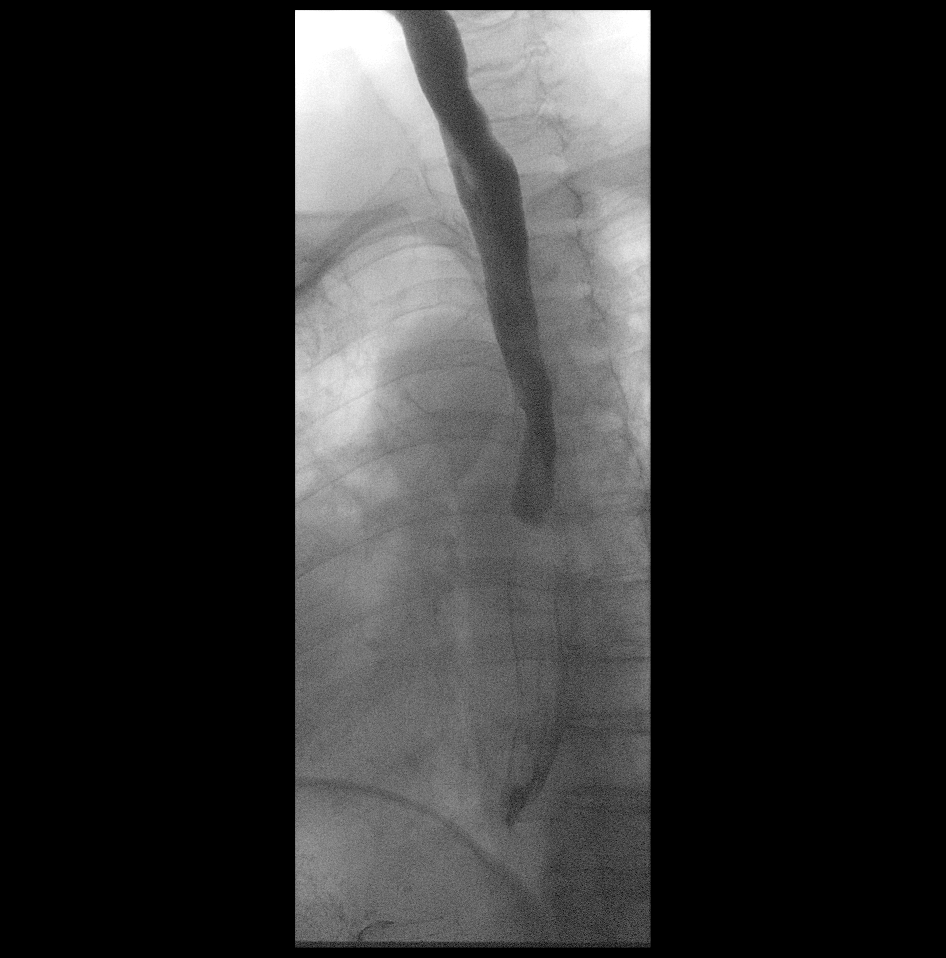

[Series 7: cp_standard · 0.27mm/px · 1 of 1 slices shown (7 of 7)]
[im 1/1]
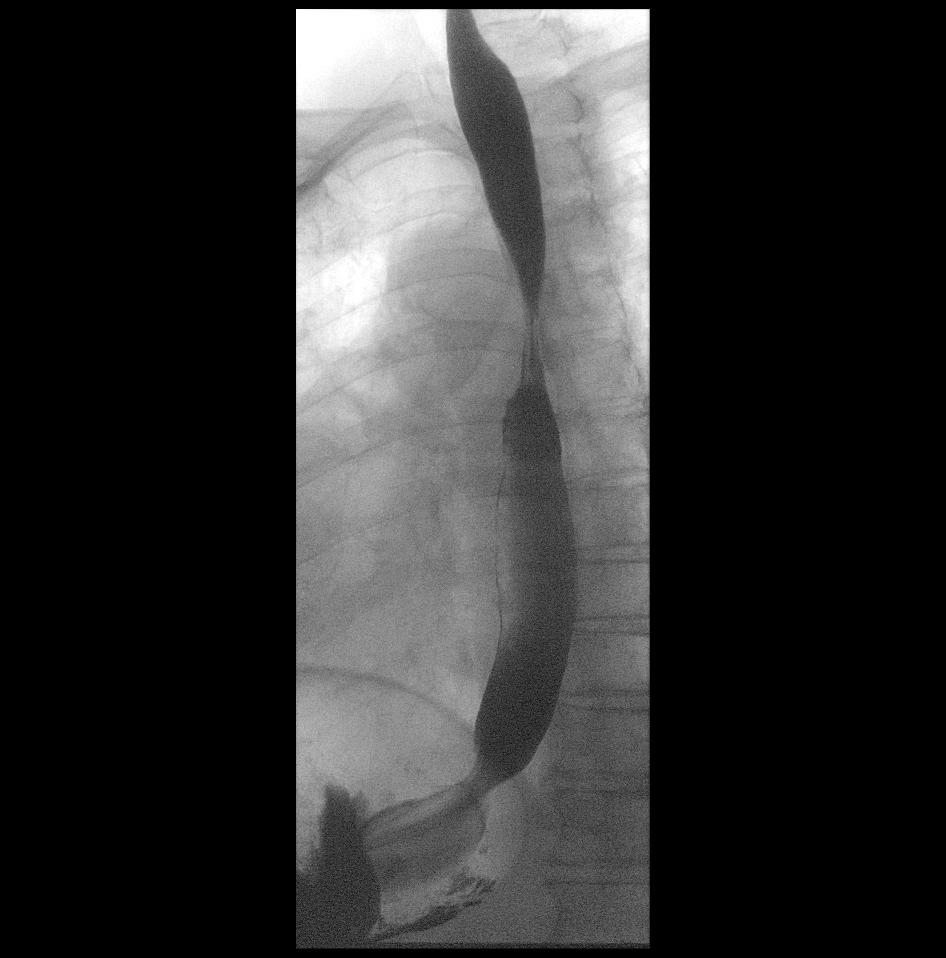

[17 of 21 positions shown; findings below may reference images not displayed]

FINDINGS: There was normal pharyngeal anatomy and motility. Contrast flowed
freely through the esophagus without evidence of stricture or mass.
There was normal esophageal mucosa without evidence of irregularity
or ulceration. Esophageal motility was normal. No evidence of
reflux. No definite hiatal hernia was demonstrated.

At the end of the examination a 13 mm barium tablet was administered
which transited through the esophagus and esophagogastric junction
without delay.
IMPRESSION: Normal barium swallow.

## 2019-09-21 ENCOUNTER — Other Ambulatory Visit: Payer: Self-pay | Admitting: Physician Assistant

## 2019-09-21 DIAGNOSIS — Z1231 Encounter for screening mammogram for malignant neoplasm of breast: Secondary | ICD-10-CM

## 2020-10-11 ENCOUNTER — Other Ambulatory Visit: Payer: Self-pay | Admitting: Physician Assistant

## 2020-10-11 DIAGNOSIS — Z1231 Encounter for screening mammogram for malignant neoplasm of breast: Secondary | ICD-10-CM

## 2021-06-01 ENCOUNTER — Other Ambulatory Visit: Payer: Self-pay

## 2021-06-01 ENCOUNTER — Emergency Department: Payer: Medicare PPO

## 2021-06-01 ENCOUNTER — Emergency Department
Admission: EM | Admit: 2021-06-01 | Discharge: 2021-06-01 | Disposition: A | Discharge status: Home / Self Care | Payer: Medicare PPO | Attending: Emergency Medicine | Admitting: Emergency Medicine

## 2021-06-01 DIAGNOSIS — J45909 Unspecified asthma, uncomplicated: Secondary | ICD-10-CM | POA: Diagnosis not present

## 2021-06-01 DIAGNOSIS — R911 Solitary pulmonary nodule: Secondary | ICD-10-CM | POA: Diagnosis not present

## 2021-06-01 DIAGNOSIS — R079 Chest pain, unspecified: Secondary | ICD-10-CM

## 2021-06-01 DIAGNOSIS — R0602 Shortness of breath: Secondary | ICD-10-CM | POA: Diagnosis present

## 2021-06-01 DIAGNOSIS — I10 Essential (primary) hypertension: Secondary | ICD-10-CM | POA: Diagnosis not present

## 2021-06-01 DIAGNOSIS — Z79899 Other long term (current) drug therapy: Secondary | ICD-10-CM | POA: Insufficient documentation

## 2021-06-01 DIAGNOSIS — R519 Headache, unspecified: Secondary | ICD-10-CM

## 2021-06-01 LAB — CBC
HCT: 35.6 % — ABNORMAL LOW (ref 36.0–46.0)
Hemoglobin: 12.4 g/dL (ref 12.0–15.0)
MCH: 29.5 pg (ref 26.0–34.0)
MCHC: 34.8 g/dL (ref 30.0–36.0)
MCV: 84.6 fL (ref 80.0–100.0)
Platelets: 135 10*3/uL — ABNORMAL LOW (ref 150–400)
RBC: 4.21 MIL/uL (ref 3.87–5.11)
RDW: 13.3 % (ref 11.5–15.5)
WBC: 4.1 10*3/uL (ref 4.0–10.5)
nRBC: 0 % (ref 0.0–0.2)

## 2021-06-01 LAB — BASIC METABOLIC PANEL
Anion gap: 3 — ABNORMAL LOW (ref 5–15)
BUN: 12 mg/dL (ref 8–23)
CO2: 30 mmol/L (ref 22–32)
Calcium: 8.4 mg/dL — ABNORMAL LOW (ref 8.9–10.3)
Chloride: 105 mmol/L (ref 98–111)
Creatinine, Ser: 0.74 mg/dL (ref 0.44–1.00)
GFR, Estimated: >60 mL/min (ref >60)
Glucose, Bld: 137 mg/dL — ABNORMAL HIGH (ref 70–99)
Potassium: 3.6 mmol/L (ref 3.5–5.1)
Sodium: 138 mmol/L (ref 135–145)

## 2021-06-01 LAB — TROPONIN I (HIGH SENSITIVITY)
Troponin I (High Sensitivity): 8 ng/L (ref <18)
Troponin I (High Sensitivity): 8 ng/L (ref <18)

## 2021-06-01 LAB — D-DIMER, QUANTITATIVE: D-Dimer, Quant: 1.79 ug/mL-FEU — ABNORMAL HIGH (ref 0.00–0.50)

## 2021-06-01 MED ORDER — ACETAMINOPHEN 500 MG PO TABS
1000.0000 mg | ORAL_TABLET | Freq: Once | ORAL | Status: AC
Start: 2021-06-01 — End: 2021-06-01
  Administered 2021-06-01: 1000 mg via ORAL
  Filled 2021-06-01: qty 2

## 2021-06-01 MED ORDER — SODIUM CHLORIDE 0.9 % IV BOLUS (SEPSIS)
1000.0000 mL | Freq: Once | INTRAVENOUS | Status: AC
  Administered 2021-06-01: 1000 mL via INTRAVENOUS

## 2021-06-01 MED ORDER — IOHEXOL 350 MG/ML SOLN
75.0000 mL | Freq: Once | INTRAVENOUS | Status: AC | PRN
  Administered 2021-06-01: 75 mL via INTRAVENOUS

## 2021-06-01 NOTE — ED Notes (Signed)
Pt to CT

## 2021-06-01 NOTE — Discharge Instructions (Addendum)
I recommend close follow-up with your primary care doctor.  I recommend that you purchase a blood pressure cuff over-the-counter and check your blood pressure at least once a day and keep a log of this information so that you can take it to your primary care doctor appointment to determine if you should be started on any blood pressure medication.  Your cardiac labs today were reassuring.  CT of your head and chest showed no sign of clot or bleeding in your brain.  You do have a right-sided pulmonary nodule.  We recommend follow-up as an outpatient for repeat imaging in 6 to 12 months to ensure stability.  I have sent a referral to the pulmonary nodule clinic for this.  This can also be followed by your primary care doctor.

## 2021-06-01 NOTE — ED Provider Notes (Addendum)
[facility] Emergency Department Provider Note  ____________________________________________   Event Date/Time   First MD Initiated Contact with Patient 06/01/21 [phone]     (approximate)  I have reviewed the triage vital signs and the nursing notes.   HISTORY  Chief Complaint Chest Pain and Headache    HPI Veronica Wright is a 74 y.o. female with history of hyperlipidemia who presents to the emergency department with sudden onset central chest tightness without radiation and severe posterior headache that started at 8 PM tonight while at rest.  States symptoms have now spontaneously resolved and she is feeling much better.  She does report having shortness of breath with the chest pain but no nausea, vomiting, diaphoresis, dizziness, fever or cough.  Denies any history of previous chronic headaches.  No head injury.  Not on blood thinners.  Denies numbness, tingling or weakness.  She states symptoms started after taking prednisone and Tessalon Perles which she has been taking recently for a lingering cough that she has had after COVID-19 about 3 weeks ago.        Past Medical History:  Diagnosis Date   Arthritis    Asthma    Depression    High cholesterol     There are no problems to display for this patient.   Past Surgical History:  Procedure Laterality Date   CHOLECYSTECTOMY     COLONOSCOPY WITH PROPOFOL N/A 02/12/2017   Procedure: COLONOSCOPY WITH PROPOFOL;  Surgeon: Lollie Sails, MD;  Location: [facility] ENDOSCOPY;  Service: Endoscopy;  Laterality: N/A;   ESOPHAGOGASTRODUODENOSCOPY (EGD) WITH PROPOFOL N/A 02/12/2017   Procedure: ESOPHAGOGASTRODUODENOSCOPY (EGD) WITH PROPOFOL;  Surgeon: Lollie Sails, MD;  Location: [facility] ENDOSCOPY;  Service: Endoscopy;  Laterality: N/A;    Prior to Admission medications   Medication Sig Start Date End Date Taking? Authorizing Provider  albuterol (PROVENTIL HFA;VENTOLIN HFA) 108 (90 BASE) MCG/ACT inhaler  Inhale 2 puffs into the lungs every 15 (fifteen) minutes as needed for wheezing or shortness of breath. 03/27/15   Menshew, Dannielle Karvonen, PA-C  budesonide-formoterol (SYMBICORT) 160-4.5 MCG/ACT inhaler Inhale 2 puffs into the lungs as needed.    [provider]  citalopram (CELEXA) 20 MG tablet Take 20 mg by mouth daily.    [provider]  Fluticasone-Salmeterol (ADVAIR) 250-50 MCG/DOSE AEPB Inhale 1 puff into the lungs as needed.    [provider]  predniSONE (DELTASONE) 10 MG tablet Take 1 tablet (10 mg total) by mouth 2 (two) times daily with a meal. Patient not taking: Reported on 02/12/2017 03/27/15   Menshew, Dannielle Karvonen, PA-C  triamcinolone (NASACORT) 55 MCG/ACT AERO nasal inhaler Place 2 sprays into the nose as needed.    [provider]  zolpidem (AMBIEN) 10 MG tablet Take 10 mg by mouth at bedtime.    [provider]    Allergies Penicillins  Family History  Problem Relation Age of Onset   Breast cancer Sister 45    Social History Social History   Tobacco Use   Smoking status: Never   Smokeless tobacco: Never  Substance Use Topics   Alcohol use: No   Drug use: No    Review of Systems Constitutional: No fever. Eyes: No visual changes. ENT: No sore throat. Cardiovascular: + chest pain. Respiratory: +  shortness of breath. Gastrointestinal: No nausea, vomiting, diarrhea. Genitourinary: Negative for dysuria. Musculoskeletal: Negative for back pain. Skin: Negative for rash. Neurological: Negative for focal weakness or numbness.  ____________________________________________   PHYSICAL EXAM:  VITAL SIGNS: ED Triage Vitals  Enc Vitals Group     BP 06/01/21 0150 (!) 178/76     Pulse Rate 06/01/21 0150 (!) 55     Resp 06/01/21 0150 16     Temp 06/01/21 0150 99.3 F (37.4 C)     Temp Source 06/01/21 0150 Oral     SpO2 06/01/21 0150 97 %     Weight 06/01/21 0152 185 lb (83.9 kg)     Height 06/01/21 0152 5' (1.524  m)     Head Circumference --      Peak Flow --      Pain Score 06/01/21 0152 10     Pain Loc --      Pain Edu? --      Excl. in [place]? --    CONSTITUTIONAL: Alert and oriented and responds appropriately to questions. Well-appearing; well-nourished, elderly, in no distress HEAD: Normocephalic, atraumatic EYES: Conjunctivae clear, pupils appear equal, EOM appear intact ENT: normal nose; moist mucous membranes NECK: Supple, normal ROM, no meningismus CARD: RRR; S1 and S2 appreciated; no murmurs, no clicks, no rubs, no gallops RESP: Normal chest excursion without splinting or tachypnea; breath sounds clear and equal bilaterally; no wheezes, no rhonchi, no rales, no hypoxia or respiratory distress, speaking full sentences ABD/GI: Normal bowel sounds; non-distended; soft, non-tender, no rebound, no guarding, no peritoneal signs, no hepatosplenomegaly BACK: The back appears normal EXT: Normal ROM in all joints; no deformity noted, no edema; no cyanosis, no calf tenderness or calf swelling SKIN: Normal color for age and race; warm; no rash on exposed skin NEURO: Moves all extremities equally, normal sensation diffusely, cranial nerves II 12 intact, normal speech, normal gait PSYCH: The patient's mood and manner are appropriate.  ____________________________________________   LABS (all labs ordered are listed, but only abnormal results are displayed)  Labs Reviewed  BASIC METABOLIC PANEL - Abnormal; Notable for the following components:      Result Value   Glucose, Bld 137 (*)    Calcium 8.4 (*)    Anion gap 3 (*)    All other components within normal limits  CBC - Abnormal; Notable for the following components:   HCT 35.6 (*)    Platelets 135 (*)    All other components within normal limits  D-DIMER, QUANTITATIVE - Abnormal; Notable for the following components:   D-Dimer, Quant 1.79 (*)    All other components within normal limits  TROPONIN I (HIGH SENSITIVITY)  TROPONIN I (HIGH  SENSITIVITY)   ____________________________________________  EKG   EKG Interpretation  Date/Time:  Thursday June 01 2021 01:48:13 EDT Ventricular Rate:  55 PR Interval:  116 QRS Duration: 84 QT Interval:  466 QTC Calculation: 445 R Axis:   50 Text Interpretation: Sinus bradycardia Otherwise normal ECG Confirmed by Pryor Curia [phone]) on 06/01/2021 3:27:11 AM        ____________________________________________  RADIOLOGY Jessie Foot Dorothye Berni, personally viewed and evaluated these images (plain radiographs) as part of my medical decision making, as well as reviewing the written report by the radiologist.  ED MD interpretation: Chest x-ray shows no acute abnormality.  CT head, CT venogram showed no intracranial hemorrhage or sinus thrombosis.  CTA chest shows no PE.  Official radiology report(s): DG Chest 2 View  Result Date: 06/01/2021 CLINICAL DATA:  Chest pain for several hours EXAM: CHEST - 2 VIEW COMPARISON:  03/27/2015 FINDINGS: Cardiac shadow is within normal limits. Lungs are well aerated bilaterally. No focal infiltrate or sizable effusion is noted. Degenerative  changes of the thoracic spine are seen. IMPRESSION: No active cardiopulmonary disease. Electronically Signed   By: Inez Catalina M.D.   On: 06/01/2021 02:18   CT HEAD WO CONTRAST (5MM)  Result Date: 06/01/2021 CLINICAL DATA:  Headache. EXAM: CT HEAD WITHOUT CONTRAST TECHNIQUE: Contiguous axial images were obtained from the base of the skull through the vertex without intravenous contrast. COMPARISON:  None. FINDINGS: Brain: There is mild cerebral atrophy with widening of the extra-axial spaces and ventricular dilatation. There are areas of decreased attenuation within the white matter tracts of the supratentorial brain, consistent with microvascular disease changes. Vascular: No hyperdense vessel or unexpected calcification. Skull: Normal. Negative for fracture or focal lesion. Sinuses/Orbits: No acute finding. Other:  None. IMPRESSION: 1. Generalized cerebral atrophy. 2. No acute intracranial abnormality. Electronically Signed   By: Virgina Norfolk M.D.   On: 06/01/2021 03:35   CT Angio Chest PE W and/or Wo Contrast  Result Date: 06/01/2021 CLINICAL DATA:  75 year old female with history of chest pain and headache. Positive D-dimer. Evaluate for pulmonary embolism. EXAM: CT ANGIOGRAPHY CHEST WITH CONTRAST TECHNIQUE: Multidetector CT imaging of the chest was performed using the standard protocol during bolus administration of intravenous contrast. Multiplanar CT image reconstructions and MIPs were obtained to evaluate the vascular anatomy. CONTRAST:  35mL OMNIPAQUE IOHEXOL 350 MG/ML SOLN COMPARISON:  No priors. FINDINGS: Cardiovascular: No filling defects within the pulmonary arterial tree to suggest the presence of pulmonary embolism. Heart size is normal. There is no significant pericardial fluid, thickening or pericardial calcification. Aortic atherosclerosis. No definite coronary artery calcifications. Mediastinum/Nodes: No pathologically enlarged mediastinal or hilar lymph nodes. Esophagus is unremarkable in appearance. No axillary lymphadenopathy. Lungs/Pleura: 11 x 5 mm (mean diameter of 8 mm) nodular area of architectural distortion in the lateral segment of the right middle lobe (axial image 63 of series 7), favored to represent an area of chronic post infectious or inflammatory scarring, but nonspecific (particularly given the lack of prior examinations). Small calcified granuloma in the medial aspect of the left upper lobe. No other suspicious appearing pulmonary nodules or masses are noted. No acute consolidative airspace disease. No pleural effusions. Upper Abdomen: Aortic atherosclerosis. Musculoskeletal: There are no aggressive appearing lytic or blastic lesions noted in the visualized portions of the skeleton. Review of the MIP images confirms the above findings. IMPRESSION: 1. No evidence of pulmonary  embolism. 2. No acute findings in the thorax to account for the patient's symptoms. 3. Nodular area of architectural distortion in the lateral segment of the right middle lobe, with a mean diameter of 8 mm, favored to represent an area of chronic post infectious or inflammatory scarring, but technically nonspecific. Non-contrast chest CT at 6-12 months is recommended. If the nodule is stable at time of repeat CT, then future CT at 18-24 months (from today's scan) is considered optional for low-risk patients, but is recommended for high-risk patients. This recommendation follows the consensus statement: Guidelines for Management of Incidental Pulmonary Nodules Detected on CT Images: From the Fleischner Society 2017; Radiology 2017; 284:228-243. 4. Aortic atherosclerosis. Aortic Atherosclerosis (ICD10-I70.0). Electronically Signed   By: Vinnie Langton M.D.   On: 06/01/2021 06:12   CT VENOGRAM HEAD  Result Date: 06/01/2021 CLINICAL DATA:  75 year old female with headache since 2000 hours. EXAM: CT VENOGRAM HEAD TECHNIQUE: Venographic phase images of the brain were obtained following the administration of intravenous contrast. Multiplanar reformats and maximum intensity projections were generated. CONTRAST:  36mL OMNIPAQUE IOHEXOL 350 MG/ML SOLN COMPARISON:  Noncontrast head CT 0327  hours today. FINDINGS: Stable CT appearance of the brain from earlier today. No abnormal intracranial enhancement identified. The major dural venous sinuses are enhancing and appear to be patent including the superior sagittal sinus, torcula, straight sinus, vein of Galen, internal cerebral veins, transverse and sigmoid sinuses. The right transverse and sigmoid sinuses are mildly dominant. Both IJ bulbs are enhancing and appear to be patent. Calcified atherosclerosis at the skull base. The major intracranial arterial structures also appear to be patent and enhancing, with dominant appearing right vertebral artery. IMPRESSION: 1.  Negative for dural venous sinus thrombosis. 2. Stable, negative CT appearance of the brain since 0327 hours today Electronically Signed   By: Genevie Ann M.D.   On: 06/01/2021 06:24    ____________________________________________   PROCEDURES  Procedure(s) performed (including Critical Care):  Procedures   ____________________________________________   INITIAL IMPRESSION / ASSESSMENT AND PLAN / ED COURSE  As part of my medical decision making, I reviewed the following data within the [place] History obtained from family, Nursing notes reviewed and incorporated, Labs reviewed , EKG interpreted , Old EKG reviewed, Old chart reviewed, Radiograph reviewed , CTs reviewed, Notes from prior ED visits, and [place] Controlled Substance Database         Patient here with sudden onset chest pain that has now resolved.  She does have risk factors for ACS.  EKG nonischemic.  Chest x-ray obtained in triage shows no infiltrate, edema, pneumothorax.  Will obtain troponin x2.  We will keep on cardiac monitoring.  Also complains of sudden onset severe posterior headache that has now completely resolved.  Will obtain CT head to rule out intracranial hemorrhage but no focal neurologic deficits to suggest CVA.  Low suspicion for meningitis, encephalitis.  ED PROGRESS  CT shows no acute abnormality.  She reports that her headache is very minimal at this time and I have low suspicion that this was an intracranial hemorrhage.  I do not feel she needs a lumbar puncture.  She is also complaining of just minimal chest pain at this time.  We did discuss that having COVID-19 recently put her little bit high risk for PE.  We will proceed with D-dimer.  Patient comfortable with this plan.  Patient's D-dimer is elevated.  We will proceed with CTA of the chest and CT venogram of the brain to rule out PE and cavernous sinus thrombosis.  First troponin negative.  Second pending.  6:30 AM  Pt's CTA chest  shows no PE.  She does have a right-sided pulmonary nodule.  I will place referral for pulmonary nodule clinic.  No other acute abnormality.  CT venogram shows no cavernous sinus thrombosis.  Anticipate discharge home if second troponin negative.  Patient's blood pressures have been intermittently elevated here.  She is not on any blood pressure medication.  I did discuss with patient given she is currently asymptomatic without headache, vision changes, numbness, tingling, weakness, chest pain or shortness of breath that I would recommend that she keep a log of her blood pressure and follow-up with her primary care provider closely determine if she should be on medications.  Patient and son at bedside are comfortable with this plan.  Signed out the oncoming ED physician to follow-up on repeat troponin.  I reviewed all nursing notes and pertinent previous records as available.  I have reviewed and interpreted any EKGs, lab and urine results, imaging (as available).  ____________________________________________   FINAL CLINICAL IMPRESSION(S) / ED DIAGNOSES  Final diagnoses:  Nonspecific chest pain  Headache in back of head  Pulmonary nodule  Hypertension, unspecified type     ED Discharge Orders          Ordered    AMB  Referral to Pulmonary Nodule Clinic        06/01/21 0628            *Please note:  Veronica Wright was evaluated in Emergency Department on 06/01/2021 for the symptoms described in the history of present illness. She was evaluated in the context of the global COVID-19 pandemic, which necessitated consideration that the patient might be at risk for infection with the SARS-CoV-2 virus that causes COVID-19. Institutional protocols and algorithms that pertain to the evaluation of patients at risk for COVID-19 are in a state of rapid change based on information released by regulatory bodies including the CDC and federal and state organizations. These policies and algorithms  were followed during the patient's care in the ED.  Some ED evaluations and interventions may be delayed as a result of limited staffing during and the pandemic.*   Note:  This document was prepared using Dragon voice recognition software and may include unintentional dictation errors.    Devontaye Ground, Delice Bison, DO 06/01/21 [place], Delice Bison, DO 06/01/21 [phone]

## 2021-06-01 NOTE — ED Provider Notes (Signed)
-----------------------------------------   7:45 AM on 06/01/2021 ----------------------------------------- Patient's troponin has resulted negative.  Patient will be discharged home with PCP follow-up.   Harvest Dark, MD 06/01/21 [phone]

## 2021-06-01 NOTE — ED Notes (Signed)
Pt back from CT

## 2021-06-01 NOTE — ED Triage Notes (Signed)
Pt presents to ER c/o chest pain and headache that started around 2000 yesterday.  Pt states she does not have a hx of heart problems and has never had this feeling before. CP is in center of chest and does not radiate.  Pt also states HA is in back of head and says its the worst headache shes ever had.  Pt denies hx of migraines.

## 2021-06-25 ENCOUNTER — Emergency Department: Payer: Medicare PPO

## 2021-06-25 ENCOUNTER — Observation Stay
Admission: EM | Admit: 2021-06-25 | Discharge: 2021-06-27 | Disposition: A | Discharge status: Home / Self Care | Payer: Medicare PPO | Attending: Student | Admitting: Student

## 2021-06-25 ENCOUNTER — Encounter: Payer: Self-pay | Admitting: Radiology

## 2021-06-25 DIAGNOSIS — R079 Chest pain, unspecified: Secondary | ICD-10-CM | POA: Diagnosis not present

## 2021-06-25 DIAGNOSIS — Z79899 Other long term (current) drug therapy: Secondary | ICD-10-CM | POA: Insufficient documentation

## 2021-06-25 DIAGNOSIS — R4701 Aphasia: Secondary | ICD-10-CM | POA: Diagnosis not present

## 2021-06-25 DIAGNOSIS — R29703 NIHSS score 3: Secondary | ICD-10-CM | POA: Diagnosis not present

## 2021-06-25 DIAGNOSIS — Z20822 Contact with and (suspected) exposure to covid-19: Secondary | ICD-10-CM | POA: Diagnosis not present

## 2021-06-25 DIAGNOSIS — I639 Cerebral infarction, unspecified: Principal | ICD-10-CM

## 2021-06-25 DIAGNOSIS — I10 Essential (primary) hypertension: Secondary | ICD-10-CM | POA: Insufficient documentation

## 2021-06-25 DIAGNOSIS — R29898 Other symptoms and signs involving the musculoskeletal system: Secondary | ICD-10-CM

## 2021-06-25 DIAGNOSIS — M6281 Muscle weakness (generalized): Secondary | ICD-10-CM | POA: Insufficient documentation

## 2021-06-25 DIAGNOSIS — R471 Dysarthria and anarthria: Secondary | ICD-10-CM

## 2021-06-25 DIAGNOSIS — R4781 Slurred speech: Secondary | ICD-10-CM | POA: Diagnosis present

## 2021-06-25 LAB — CBC WITH DIFFERENTIAL/PLATELET
Abs Immature Granulocytes: 0.05 10*3/uL (ref 0.00–0.07)
Basophils Absolute: 0 10*3/uL (ref 0.0–0.1)
Basophils Relative: 0 %
Eosinophils Absolute: 0.1 10*3/uL (ref 0.0–0.5)
Eosinophils Relative: 1 %
HCT: 41.2 % (ref 36.0–46.0)
Hemoglobin: 14 g/dL (ref 12.0–15.0)
Immature Granulocytes: 1 %
Lymphocytes Relative: 27 %
Lymphs Abs: 1.8 10*3/uL (ref 0.7–4.0)
MCH: 27.6 pg (ref 26.0–34.0)
MCHC: 34 g/dL (ref 30.0–36.0)
MCV: 81.1 fL (ref 80.0–100.0)
Monocytes Absolute: 0.7 10*3/uL (ref 0.1–1.0)
Monocytes Relative: 11 %
Neutro Abs: 4.2 10*3/uL (ref 1.7–7.7)
Neutrophils Relative %: 60 %
Platelets: 204 10*3/uL (ref 150–400)
RBC: 5.08 MIL/uL (ref 3.87–5.11)
RDW: 13.2 % (ref 11.5–15.5)
Smear Review: UNDETERMINED
WBC: 6.9 10*3/uL (ref 4.0–10.5)
nRBC: 0 % (ref 0.0–0.2)

## 2021-06-25 LAB — CBG MONITORING, ED: Glucose-Capillary: 101 mg/dL — ABNORMAL HIGH (ref 70–99)

## 2021-06-25 LAB — COMPREHENSIVE METABOLIC PANEL
ALT: 21 U/L (ref 0–44)
AST: 32 U/L (ref 15–41)
Albumin: 3.4 g/dL — ABNORMAL LOW (ref 3.5–5.0)
Alkaline Phosphatase: 89 U/L (ref 38–126)
Anion gap: 8 (ref 5–15)
BUN: 16 mg/dL (ref 8–23)
CO2: 29 mmol/L (ref 22–32)
Calcium: 9.2 mg/dL (ref 8.9–10.3)
Chloride: 102 mmol/L (ref 98–111)
Creatinine, Ser: 0.96 mg/dL (ref 0.44–1.00)
GFR, Estimated: >60 mL/min (ref >60)
Glucose, Bld: 108 mg/dL — ABNORMAL HIGH (ref 70–99)
Potassium: 3.3 mmol/L — ABNORMAL LOW (ref 3.5–5.1)
Sodium: 139 mmol/L (ref 135–145)
Total Bilirubin: 1.4 mg/dL — ABNORMAL HIGH (ref 0.3–1.2)
Total Protein: 7.1 g/dL (ref 6.5–8.1)

## 2021-06-25 LAB — LIPASE, BLOOD: Lipase: 33 U/L (ref 11–51)

## 2021-06-25 LAB — TROPONIN I (HIGH SENSITIVITY): Troponin I (High Sensitivity): 7 ng/L (ref <18)

## 2021-06-25 MED ORDER — ASPIRIN 300 MG RE SUPP
300.0000 mg | Freq: Once | RECTAL | Status: AC
  Administered 2021-06-26: 300 mg via RECTAL
  Filled 2021-06-25: qty 1

## 2021-06-25 MED ORDER — IOHEXOL 350 MG/ML SOLN
75.0000 mL | Freq: Once | INTRAVENOUS | Status: AC | PRN
  Administered 2021-06-25: 75 mL via INTRAVENOUS

## 2021-06-25 NOTE — ED Provider Notes (Signed)
Emergency Medicine Provider Triage Evaluation Note  Veronica Wright , a 75 y.o. female  was evaluated in triage.  Pt complains of new word finding difficulties.  Son brings patient to the ED for evaluation of acute aphasia and word finding difficulties.  Symptoms started around 7 PM this evening when she was trying to pick up some prescriptions at the [facility], but was having difficulty getting her words out.  Son called her later this evening, who noted these symptoms and brought her to the ED.[name]  Review of Systems  Positive: Word finding difficulties and aphasia Negative: Syncope, falls, headache, gait change  Physical Exam  There were no vitals taken for this visit. Gen:   Awake, no distress .  Becomes quite tearful and crying when she is unable to express herself Resp:  Normal effort  MSK:   Moves extremities without difficulty Other:  5/5 strength and sensation all 4 extremities.  Aphasia and word finding difficulties are noted.  Medical Decision Making  Medically screening exam initiated at 11:19 PM.  Appropriate orders placed.  Veronica Wright was informed that the remainder of the evaluation will be completed by another provider, this initial triage assessment does not replace that evaluation, and the importance of remaining in the ED until their evaluation is complete.  Acute aphasia and word finding difficulties, code stroke.   Vladimir Crofts, MD 06/25/21 [phone]

## 2021-06-25 NOTE — ED Notes (Signed)
Pt taken to CT at this time.

## 2021-06-25 NOTE — Consult Note (Addendum)
[facility]rology Consult Services   Patient Name:   Veronica Wright, Veronica Wright Date of Birth:   August 14, 1946 Identification Number:   MRN - [id number] Date of Service:   06/25/2021 23:15:53  Diagnosis:       I63.9 - Cerebrovascular accident (CVA), unspecified mechanism ([place])  Impression:      This is a 75 y.o. female with HTN who presents for slurred speech since 1800 on 10/30. She also on exam had left leg weakness. She denies past stroke. She does not take blood thinners. NIHSS is 3 on arrival. She is hypertensive on arrival as well.    Exam suggests possible right hemisphere stroke, possible internal capsule. She did, however, report wordfinding issues to others in the ED, though did not report this to me, so could consider L MCA territory as well. She was outside the window for tPA. The CTA did not show an interveneable LVO. The vertebral artery is not intervenable given good flow in the dominant vertebral artery and basilar. Management herein.  Metrics: Last Known Well: 06/25/2021 18:00:00 [facility] Notification Time: 06/25/2021 23:15:53 Arrival Time: 06/25/2021 22:28:00 Stamp Time: 06/25/2021 23:15:53 Initial Response Time: 06/25/2021 23:18:03 Symptoms: slurred speech, left leg weakness. NIHSS Start Assessment Time: 06/25/2021 23:18:03 Patient is not a candidate for Thrombolytic. Thrombolytic Medical Decision: 06/25/2021 23:18:03 Patient was not deemed candidate for Thrombolytic because of following reasons: Last Well Known Above 4.5 Hours.  CT head was reviewed and results were: I personally Reviewed the CT Head and it Showed no acute pathology or hemorrhage.  ED Physician notified of diagnostic impression and management plan on 06/25/2021 23:40:31  Advanced Imaging: CTA Head and Neck Ordered: IMPRESSION: 1. Negative CTA for emergent large vessel occlusion. 2. 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 Dominant right vertebral artery widely patent. 3. Predominant fetal type origin of the PCAs bilaterally with overall diminutive vertebrobasilar system. 4. Otherwise wide patency of the major arterial vasculature of the head and neck. No other hemodynamically significant or correctable stenosis.    Our recommendations are outlined below.  Recommendations:        Stroke/Telemetry Floor       Neuro Checks       Bedside Swallow Eval       DVT Prophylaxis       IV Fluids, Normal Saline       Head of Bed 30 Degrees       Euglycemia and Avoid Hyperthermia (PRN Acetaminophen)       Antihypertensives PRN if Blood pressure is greater than 220/120 or there is a concern for End organ damage/contraindications for permissive HTN. If blood pressure is greater than 220/120 give labetalol PO or IV or Vasotec IV with a goal of 15% reduction in BP during the first 24 hours.       Aspirin 325 mg once now, then 81 mg daily.       Atorvastatin 40 mg daily.       Brain MRI without contrast       If acute stroke on MRI brain, without hemorrhage, also give 300 mg clopidogrel one time, followed by 21 days of clopidogrel 75 mg daily.       TTE in hospital and Holter monitor on discharge.       HbA1C, Lipid Panel, TSH  Routine Consultation with [facility] for Follow up Care  Sign Out:       Discussed with Emergency Department Provider    ------------------------------------------------------------------------------  History of Present Illness: Patient is a 75 year old Female.  Patient was brought by private transportation with symptoms of slurred speech, left leg weakness.  This is a 75 y.o. female with HTN who presents for slurred speech since 1800 on 10/30. She also on exam had left leg weakness. She denies past stroke. She does not take blood thinners. NIHSS is 3 on arrival. She is hypertensive on arrival as well.   Past Medical History:       Hypertension  Social History: Drug Use: No  Family History:      noncontributory  Review of System:  14 Points Review of Systems was performed and was negative except mentioned in HPI.  Anticoagulant use:  No  Antiplatelet use: No  Allergies:  Reviewed    Examination: BP(144/73), Pulse(80), Blood Glucose(101) 1A: Level of Consciousness - Alert; keenly responsive + 0 1B: Ask Month and Age - Both Questions Right + 0 1C: Blink Eyes & Squeeze Hands - Performs Both Tasks + 0 2: Test Horizontal Extraocular Movements - Normal + 0 3: Test Visual Fields - No Visual Loss + 0 4: Test Facial Palsy (Use Grimace if Obtunded) - Normal symmetry + 0 5A: Test Left Arm Motor Drift - No Drift for 10 Seconds + 0 5B: Test Right Arm Motor Drift - No Drift for 10 Seconds + 0 6A: Test Left Leg Motor Drift - Drift, but doesn't hit bed + 1 6B: Test Right Leg Motor Drift - No Drift for 5 Seconds + 0 7: Test Limb Ataxia (FNF/Heel-Shin) - No Ataxia + 0 8: Test Sensation - Mild-Moderate Loss: Less Sharp/More Dull + 1 9: Test Language/Aphasia - Normal; No aphasia + 0 10: Test Dysarthria - Mild-Moderate Dysarthria: Slurring but can be understood + 1 11: Test Extinction/Inattention - No abnormality + 0  NIHSS Score: 3  NIHSS Free Text : left leg numb  Pre-Morbid Modified Rankin Scale: 0 Points = No symptoms at all   Patient/Family was informed the Neurology Consult would occur via TeleHealth consult by way of interactive audio and video telecommunications and consented to receiving care in this manner.   Patient is being evaluated for possible acute neurologic impairment and high probability of imminent or life-threatening deterioration. I spent total of 35 minutes providing care to this patient, including time for face to face visit via telemedicine, review of medical records, imaging studies and discussion of findings with providers, the patient and/or family.   Dr Allena Earing   [facility] [phone]  Case [id number]

## 2021-06-26 ENCOUNTER — Inpatient Hospital Stay (HOSPITAL_COMMUNITY)
Admit: 2021-06-26 | Discharge: 2021-06-26 | Disposition: A | Discharge status: Home / Self Care | Payer: Medicare PPO | Attending: Internal Medicine | Admitting: Internal Medicine

## 2021-06-26 ENCOUNTER — Encounter: Payer: Self-pay | Admitting: Emergency Medicine

## 2021-06-26 ENCOUNTER — Other Ambulatory Visit: Payer: Self-pay

## 2021-06-26 ENCOUNTER — Inpatient Hospital Stay: Payer: Medicare PPO

## 2021-06-26 DIAGNOSIS — I639 Cerebral infarction, unspecified: Secondary | ICD-10-CM | POA: Diagnosis not present

## 2021-06-26 DIAGNOSIS — I6389 Other cerebral infarction: Secondary | ICD-10-CM

## 2021-06-26 DIAGNOSIS — I10 Essential (primary) hypertension: Secondary | ICD-10-CM

## 2021-06-26 LAB — URINALYSIS, ROUTINE W REFLEX MICROSCOPIC
Bilirubin Urine: NEGATIVE
Glucose, UA: NEGATIVE mg/dL
Ketones, ur: NEGATIVE mg/dL
Nitrite: NEGATIVE
Protein, ur: NEGATIVE mg/dL
Specific Gravity, Urine: 1.03 (ref 1.005–1.030)
WBC, UA: >50 WBC/hpf — ABNORMAL HIGH (ref 0–5)
pH: 5 (ref 5.0–8.0)

## 2021-06-26 LAB — BASIC METABOLIC PANEL
Anion gap: 6 (ref 5–15)
BUN: 15 mg/dL (ref 8–23)
CO2: 26 mmol/L (ref 22–32)
Calcium: 8.7 mg/dL — ABNORMAL LOW (ref 8.9–10.3)
Chloride: 103 mmol/L (ref 98–111)
Creatinine, Ser: 0.86 mg/dL (ref 0.44–1.00)
GFR, Estimated: >60 mL/min (ref >60)
Glucose, Bld: 183 mg/dL — ABNORMAL HIGH (ref 70–99)
Potassium: 3.3 mmol/L — ABNORMAL LOW (ref 3.5–5.1)
Sodium: 135 mmol/L (ref 135–145)

## 2021-06-26 LAB — ECHOCARDIOGRAM COMPLETE
AR max vel: 4.28 cm2
AV Area VTI: 5.39 cm2
AV Area mean vel: 5.12 cm2
AV Mean grad: 2 mmHg
AV Peak grad: 5.7 mmHg
Ao pk vel: 1.19 m/s
Area-P 1/2: 3.13 cm2
Height: 60 in
MV VTI: 3.5 cm2
S' Lateral: 3.2 cm
Weight: 2960 oz

## 2021-06-26 LAB — CBC
HCT: 41.6 % (ref 36.0–46.0)
HCT: 37.2 % (ref 36.0–46.0)
Hemoglobin: 14.3 g/dL (ref 12.0–15.0)
Hemoglobin: 12.5 g/dL (ref 12.0–15.0)
MCH: 28.1 pg (ref 26.0–34.0)
MCH: 27.5 pg (ref 26.0–34.0)
MCHC: 34.4 g/dL (ref 30.0–36.0)
MCHC: 33.6 g/dL (ref 30.0–36.0)
MCV: 81.7 fL (ref 80.0–100.0)
MCV: 81.9 fL (ref 80.0–100.0)
Platelets: 192 10*3/uL (ref 150–400)
Platelets: 178 10*3/uL (ref 150–400)
RBC: 5.09 MIL/uL (ref 3.87–5.11)
RBC: 4.54 MIL/uL (ref 3.87–5.11)
RDW: 13.2 % (ref 11.5–15.5)
RDW: 13.3 % (ref 11.5–15.5)
WBC: 7.2 10*3/uL (ref 4.0–10.5)
WBC: 5.2 10*3/uL (ref 4.0–10.5)
nRBC: 0 % (ref 0.0–0.2)
nRBC: 0 % (ref 0.0–0.2)

## 2021-06-26 LAB — RESP PANEL BY RT-PCR (FLU A&B, COVID) ARPGX2
Influenza A by PCR: NEGATIVE
Influenza B by PCR: NEGATIVE
SARS Coronavirus 2 by RT PCR: NEGATIVE

## 2021-06-26 LAB — TROPONIN I (HIGH SENSITIVITY): Troponin I (High Sensitivity): 7 ng/L (ref <18)

## 2021-06-26 LAB — URINE DRUG SCREEN, QUALITATIVE (ARMC ONLY)
Amphetamines, Ur Screen: NOT DETECTED
Barbiturates, Ur Screen: NOT DETECTED
Benzodiazepine, Ur Scrn: NOT DETECTED
Cannabinoid 50 Ng, Ur ~~LOC~~: NOT DETECTED
Cocaine Metabolite,Ur ~~LOC~~: NOT DETECTED
MDMA (Ecstasy)Ur Screen: NOT DETECTED
Methadone Scn, Ur: NOT DETECTED
Opiate, Ur Screen: NOT DETECTED
Phencyclidine (PCP) Ur S: NOT DETECTED
Tricyclic, Ur Screen: NOT DETECTED

## 2021-06-26 LAB — HEMOGLOBIN A1C
Hgb A1c MFr Bld: 5.8 % — ABNORMAL HIGH (ref 4.8–5.6)
Mean Plasma Glucose: 119.76 mg/dL

## 2021-06-26 LAB — FOLATE: Folate: 12.4 ng/mL (ref >5.9)

## 2021-06-26 LAB — MAGNESIUM: Magnesium: 1.9 mg/dL (ref 1.7–2.4)

## 2021-06-26 LAB — LIPID PANEL
Cholesterol: 158 mg/dL (ref 0–200)
HDL: 22 mg/dL — ABNORMAL LOW (ref >40)
LDL Cholesterol: 111 mg/dL — ABNORMAL HIGH (ref 0–99)
Total CHOL/HDL Ratio: 7.2 RATIO
Triglycerides: 124 mg/dL (ref <150)
VLDL: 25 mg/dL (ref 0–40)

## 2021-06-26 LAB — IRON AND TIBC
Iron: 40 ug/dL (ref 28–170)
Saturation Ratios: 18 % (ref 10.4–31.8)
TIBC: 217 ug/dL — ABNORMAL LOW (ref 250–450)
UIBC: 177 ug/dL

## 2021-06-26 LAB — CREATININE, SERUM
Creatinine, Ser: 1.08 mg/dL — ABNORMAL HIGH (ref 0.44–1.00)
GFR, Estimated: 54 mL/min — ABNORMAL LOW (ref >60)

## 2021-06-26 LAB — PHOSPHORUS: Phosphorus: 3.1 mg/dL (ref 2.5–4.6)

## 2021-06-26 LAB — VITAMIN D 25 HYDROXY (VIT D DEFICIENCY, FRACTURES): Vit D, 25-Hydroxy: 27.83 ng/mL — ABNORMAL LOW (ref 30–100)

## 2021-06-26 LAB — VITAMIN B12: Vitamin B-12: 290 pg/mL (ref 180–914)

## 2021-06-26 LAB — ETHANOL: Alcohol, Ethyl (B): <10 mg/dL (ref <10)

## 2021-06-26 MED ORDER — ACETAMINOPHEN 160 MG/5ML PO SOLN
650.0000 mg | ORAL | Status: DC | PRN
  Filled 2021-06-26: qty 20.3

## 2021-06-26 MED ORDER — ACETAMINOPHEN 325 MG RE SUPP: 650.0000 mg | RECTAL | Status: DC | PRN

## 2021-06-26 MED ORDER — ACETAMINOPHEN 325 MG PO TABS: 650.0000 mg | ORAL_TABLET | ORAL | Status: DC | PRN

## 2021-06-26 MED ORDER — ENOXAPARIN SODIUM 40 MG/0.4ML IJ SOSY
0.5000 mg/kg | PREFILLED_SYRINGE | INTRAMUSCULAR | Status: DC
  Administered 2021-06-26 – 2021-06-27 (×2): 42.5 mg via SUBCUTANEOUS
  Filled 2021-06-26 (×2): qty 0.8

## 2021-06-26 MED ORDER — LORAZEPAM 1 MG PO TABS
1.0000 mg | ORAL_TABLET | Freq: Once | ORAL | Status: AC | PRN
  Administered 2021-06-26: 1 mg via ORAL
  Filled 2021-06-26: qty 1

## 2021-06-26 MED ORDER — SODIUM CHLORIDE 0.9 % IV SOLN: INTRAVENOUS | Status: DC

## 2021-06-26 MED ORDER — ATORVASTATIN CALCIUM 20 MG PO TABS
40.0000 mg | ORAL_TABLET | Freq: Every day | ORAL | Status: DC
  Administered 2021-06-26 – 2021-06-27 (×2): 40 mg via ORAL
  Filled 2021-06-26 (×2): qty 2

## 2021-06-26 MED ORDER — ASPIRIN EC 81 MG PO TBEC
81.0000 mg | DELAYED_RELEASE_TABLET | Freq: Every day | ORAL | Status: DC
  Administered 2021-06-26 – 2021-06-27 (×2): 81 mg via ORAL
  Filled 2021-06-26 (×2): qty 1

## 2021-06-26 MED ORDER — SODIUM CHLORIDE 0.9 % IV SOLN
1.0000 g | INTRAVENOUS | Status: DC
  Administered 2021-06-26 – 2021-06-27 (×2): 1 g via INTRAVENOUS
  Filled 2021-06-26 (×2): qty 10

## 2021-06-26 MED ORDER — STROKE: EARLY STAGES OF RECOVERY BOOK: Freq: Once | Status: DC

## 2021-06-26 NOTE — ED Notes (Signed)
Pt provided with cup of ice water per request.

## 2021-06-26 NOTE — ED Provider Notes (Signed)
[facility] Emergency Department Provider Note  ____________________________________________   Event Date/Time   First MD Initiated Contact with Patient 06/25/21 2311     (approximate)  I have reviewed the triage vital signs and the nursing notes.   HISTORY  Chief Complaint Chest Pain, Aphasia, and Code Stroke (Code stroke called at 22:47)  Level 5 caveat:  history/ROS limited by acute/critical illness  HPI Veronica Wright is a 75 y.o. female with medical history as listed below who presents with her son for evaluation of difficulty speaking.  Reportedly she felt the symptoms started acutely around 6 PM.  She was at the grocery store and trying to speak if she could not get her words out.  When she spoke the words were often garbled and an appropriate according to her son.  She was very frustrated because she knew this was happening but could not control it.  Her symptoms are waxing and waning in the emergency department and occasionally she is able to speak clearly and coherently and then she will either stutter or not be able to find words with the wrong words will come out.  She has also had slurring of her speech.  And so she came to the emergency department she was unaware that her left leg is also weak.  She feels no weakness in her arms.  She has no pain other than some mild pressure in her chest but that started after she discovered the other symptoms.  She denies shortness of breath, nausea, and vomiting.  She has had no recent injuries.     Past Medical History:  Diagnosis Date   Arthritis    Asthma    Depression    High cholesterol     Patient Active Problem List   Diagnosis Date Noted   Acute CVA (cerebrovascular accident) ([place]) 06/26/2021   HTN (hypertension) 06/26/2021    Past Surgical History:  Procedure Laterality Date   CHOLECYSTECTOMY     COLONOSCOPY WITH PROPOFOL N/A 02/12/2017   Procedure: COLONOSCOPY WITH PROPOFOL;  Surgeon:  Lollie Sails, MD;  Location: [facility] ENDOSCOPY;  Service: Endoscopy;  Laterality: N/A;   ESOPHAGOGASTRODUODENOSCOPY (EGD) WITH PROPOFOL N/A 02/12/2017   Procedure: ESOPHAGOGASTRODUODENOSCOPY (EGD) WITH PROPOFOL;  Surgeon: Lollie Sails, MD;  Location: [facility] ENDOSCOPY;  Service: Endoscopy;  Laterality: N/A;    Prior to Admission medications   Medication Sig Start Date End Date Taking? Authorizing Provider  albuterol (PROVENTIL HFA;VENTOLIN HFA) 108 (90 BASE) MCG/ACT inhaler Inhale 2 puffs into the lungs every 15 (fifteen) minutes as needed for wheezing or shortness of breath. 03/27/15   Menshew, Dannielle Karvonen, PA-C  budesonide-formoterol (SYMBICORT) 160-4.5 MCG/ACT inhaler Inhale 2 puffs into the lungs as needed.    [provider]  citalopram (CELEXA) 20 MG tablet Take 20 mg by mouth daily.    [provider]  Fluticasone-Salmeterol (ADVAIR) 250-50 MCG/DOSE AEPB Inhale 1 puff into the lungs as needed.    [provider]  predniSONE (DELTASONE) 10 MG tablet Take 1 tablet (10 mg total) by mouth 2 (two) times daily with a meal. Patient not taking: Reported on 02/12/2017 03/27/15   Menshew, Dannielle Karvonen, PA-C  triamcinolone (NASACORT) 55 MCG/ACT AERO nasal inhaler Place 2 sprays into the nose as needed.    [provider]  zolpidem (AMBIEN) 10 MG tablet Take 10 mg by mouth at bedtime.    [provider]    Allergies Penicillins  Family History  Problem Relation Age of  Onset   Breast cancer Sister 28    Social History Social History   Tobacco Use   Smoking status: Never   Smokeless tobacco: Never  Substance Use Topics   Alcohol use: No   Drug use: No    Review of Systems Level 5 caveat:  history/ROS limited by acute/critical illness  ____________________________________________   PHYSICAL EXAM:  VITAL SIGNS: ED Triage Vitals [06/25/21 2345]  Enc Vitals Group     BP 130/73     Pulse Rate 76     Resp 20     Temp      Temp  src      SpO2 98 %     Weight      Height      Head Circumference      Peak Flow      Pain Score      Pain Loc      Pain Edu?      Excl. in [place]?     Constitutional: Alert and oriented.  Eyes: Conjunctivae are normal.  Head: Atraumatic. Nose: No congestion/rhinnorhea. Mouth/Throat: Patient is wearing a mask. Neck: No stridor.  No meningeal signs.   Cardiovascular: Normal rate, regular rhythm. Good peripheral circulation. Respiratory: Normal respiratory effort.  No retractions. Gastrointestinal: Soft and nontender. No distention.  Musculoskeletal: No lower extremity tenderness nor edema. No gross deformities of extremities. Skin:  Skin is warm, dry and intact. Psychiatric: Mood and affect are normal under the circumstances. Neurologic: Dysarthria.  No obvious facial droop.  Word finding difficulties consistent with expressive aphasia.  Unable to raise the left leg more than a few centimeters off the bed and it falls right back down onto the bed.  Otherwise no obvious weakness in her extremities.  Initially passed the swallow screen but then on the second attempt was not able to swallow liquid.  NIH Stroke Scale  Interval: Baseline Time: 23:10 Person Administering Scale: Hinda Kehr  Administer stroke scale items in the order listed. Record performance in each category after each subscale exam. Do not go back and change scores. Follow directions provided for each exam technique. Scores should reflect what the patient does, not what the clinician thinks the patient can do. The clinician should record answers while administering the exam and work quickly. Except where indicated, the patient should not be coached (i.e., repeated requests to patient to make a special effort).   1a  Level of consciousness: 0=alert; keenly responsive  1b. LOC questions:  0=Performs both tasks correctly  1c. LOC commands: 0=Performs both tasks correctly  2.  Best Gaze: 0=normal  3.  Visual: 0=No visual  loss  4. Facial Palsy: 0=Normal symmetric movement  5a.  Motor left arm: 0=No drift, limb holds 90 (or 45) degrees for full 10 seconds  5b.  Motor right arm: 0=No drift, limb holds 90 (or 45) degrees for full 10 seconds  6a. motor left leg: 2=Some effort against gravity, limb cannot get to or maintain (if cured) 90 (or 45) degrees, drifts down to bed, but has some effort against gravity  6b  Motor right leg:  0=No drift, limb holds 90 (or 45) degrees for full 10 seconds  7. Limb Ataxia: 0=Absent  8.  Sensory: 0=Normal; no sensory loss  9. Best Language:  1=Mild to moderate aphasia; some obvious loss of fluency or facility of comprehension without significant limitation on ideas expressed or form of expression.  10. Dysarthria: 1=Mild to moderate, patient slurs at least some words and  at worst, can be understood with some difficulty  11. Extinction and Inattention: 0=No abnormality  12. Distal motor function: 0=Normal   Total:   4     ____________________________________________   LABS (all labs ordered are listed, but only abnormal results are displayed)  Labs Reviewed  COMPREHENSIVE METABOLIC PANEL - Abnormal; Notable for the following components:      Result Value   Potassium 3.3 (*)    Glucose, Bld 108 (*)    Albumin 3.4 (*)    Total Bilirubin 1.4 (*)    All other components within normal limits  CBG MONITORING, ED - Abnormal; Notable for the following components:   Glucose-Capillary 101 (*)    All other components within normal limits  RESP PANEL BY RT-PCR (FLU A&B, COVID) ARPGX2  ETHANOL  LIPASE, BLOOD  CBC WITH DIFFERENTIAL/PLATELET  URINE DRUG SCREEN, QUALITATIVE ([facility] ONLY)  URINALYSIS, ROUTINE W REFLEX MICROSCOPIC  HEMOGLOBIN A1C  LIPID PANEL  CBC  CREATININE, SERUM  TYPE AND SCREEN  TROPONIN I (HIGH SENSITIVITY)  TROPONIN I (HIGH SENSITIVITY)   ____________________________________________  EKG  ED ECG REPORT I, Hinda Kehr, the attending physician,  personally viewed and interpreted this ECG.  Date: 06/25/2021 EKG Time: 22: 50 Rate: 97 Rhythm: normal sinus rhythm QRS Axis: normal Intervals: normal ST/T Wave abnormalities: normal Narrative Interpretation: no evidence of acute ischemia  ____________________________________________  RADIOLOGY I, Hinda Kehr, personally viewed and evaluated these images (plain radiographs) as part of my medical decision making, as well as reviewing the written report by the radiologist.  I also discussed the results with Dr. Jeannine Boga with radiology.  ED MD interpretation: No acute findings on CT head without contrast.  CTA head and neck showed no intervene able LVO lesions.  Official radiology report(s): No results found.  ____________________________________________   PROCEDURES   Procedure(s) performed (including Critical Care):  .1-3 Lead EKG Interpretation Performed by: Hinda Kehr, MD Authorized by: Hinda Kehr, MD     Interpretation: normal     ECG rate:  72   ECG rate assessment: normal     Rhythm: sinus rhythm     Ectopy: none     Conduction: normal   .Critical Care Performed by: Hinda Kehr, MD Authorized by: Hinda Kehr, MD   Critical care provider statement:    Critical care time (minutes):  60   Critical care time was exclusive of:  Separately billable procedures and treating other patients   Critical care was necessary to treat or prevent imminent or life-threatening deterioration of the following conditions:  CNS failure or compromise   Critical care was time spent personally by me on the following activities:  Development of treatment plan with patient or surrogate, evaluation of patient's response to treatment, examination of patient, obtaining history from patient or surrogate, ordering and performing treatments and interventions, ordering and review of laboratory studies, ordering and review of radiographic studies, pulse oximetry, re-evaluation of  patient's condition and review of old charts   ____________________________________________   INITIAL IMPRESSION / MDM / [place] / ED COURSE  As part of my medical decision making, I reviewed the following data within the electronic MEDICAL RECORD NUMBER History obtained from family, Nursing notes reviewed and incorporated, Labs reviewed , EKG interpreted , Old chart reviewed, Discussed with teleneurology, Discussed with admitting physician , Discussed with radiologist, A consult was requested and obtained from this/these consultant(s) Neurology, and Notes from prior ED visits   Differential diagnosis includes, but is not limited to, CVA, intracranial  hemorrhage, medication or drug side effect, acute encephalopathy, acute infection.  The patient is on the cardiac monitor to evaluate for evidence of arrhythmia and/or significant heart rate changes.  Strongly suspect CVA.  Patient is right at the border of tPA recommendations.  Patient was identified in triage by another ED provider as a code stroke and he informed me immediately and sent the patient to CT scan.  I initiated code stroke.  Patient is getting teleneurology evaluation at bedside at this time.  No airway compromise.  Standard CVA work-up is pending.     Clinical Course as of 06/26/21 3151  Sun Jun 25, 2021  2326 Discussed by phone with Dr. Jeannine Boga with radiology.  He reported that the non-contrast head CT appears normal with no obvious acute findings. [CF]  2342 Spoke by phone with the telemetry neurologist.  He agrees that most likely the symptoms are the result of the CVA but the acuity is unclear.  The last known normal is, in his opinion, about 6 PM, and he feels that she is outside the window for safe tPA.  He recommends CTA head and neck to rule out LVO and will follow along with imaging results.  He recommends full dose aspirin and admission for full stroke work-up if there are no intervene able LVO lesions.  The  patient passed her first stroke swallow screen but as per her nurse Erlene Quan, failed the second 1.  I am ordering the full dose aspirin but she may require rectal aspirin if she cannot take the aspirin safely by mouth.  Ordering CTA head and neck.  I will update the patient and her son. [CF]  Mon Jun 26, 2021  0111 Troponin I (High Sensitivity): 7 [CF]  0111 Comprehensive metabolic panel(!) Reassuring CMP results [CF]  0111 CBC with Differential Normal CBC [CF]  0230 CTA results did not cross over.  I called radiology and verified that there is no intervenable lesion.  Will admit to hospitalist service.  Nursing updated patient and family. [CF]  0259 Discussed with Dr. Damita Dunnings with the hospitalist service. [CF]    Clinical Course User Index [CF] Hinda Kehr, MD     ____________________________________________  FINAL CLINICAL IMPRESSION(S) / ED DIAGNOSES  Final diagnoses:  Cerebrovascular accident (CVA), unspecified mechanism ([place])  Expressive aphasia  Dysarthria  Left leg weakness     MEDICATIONS GIVEN DURING THIS VISIT:  Medications   stroke: mapping our early stages of recovery book (0 each Does not apply Hold 06/26/21 0344)  0.9 %  sodium chloride infusion ( Intravenous New Bag/Given 06/26/21 0342)  acetaminophen (TYLENOL) tablet 650 mg (has no administration in time range)    Or  acetaminophen (TYLENOL) 160 MG/5ML solution 650 mg (has no administration in time range)    Or  acetaminophen (TYLENOL) suppository 650 mg (has no administration in time range)  enoxaparin (LOVENOX) injection 42.5 mg (has no administration in time range)  aspirin EC tablet 81 mg (has no administration in time range)  atorvastatin (LIPITOR) tablet 40 mg (has no administration in time range)  aspirin suppository 300 mg (300 mg Rectal Given 06/26/21 0030)  iohexol (OMNIPAQUE) 350 MG/ML injection 75 mL (75 mLs Intravenous Contrast Given 06/25/21 2359)     ED Discharge Orders     None         Note:  This document was prepared using Dragon voice recognition software and may include unintentional dictation errors.   Hinda Kehr, MD 06/26/21 [phone]

## 2021-06-26 NOTE — ED Notes (Signed)
Pt ambulated to BR with steady gait. Son at bedside assisting.

## 2021-06-26 NOTE — ED Notes (Signed)
Hospitalist at bedside assessing pt and updating pt and son on plan of care.

## 2021-06-26 NOTE — Plan of Care (Signed)
Patient was admitted overnight due to strokelike symptoms which has been resolved.  Patient was seen by telemetry neurologist, out of window for tPA.  Recommended MRI which is negative, TTE is pending.  Patient was seen by PT and OT, recommended no needs. Follow neurology consult tomorrow a.m. and disposition plan, patient may need Zio patch on discharge and may need DAPT for 21 days??  Depends on neuro recommendation.

## 2021-06-26 NOTE — Progress Notes (Signed)
*  PRELIMINARY RESULTS* Echocardiogram 2D Echocardiogram has been performed.  Veronica Wright 06/26/2021, 9:43 AM

## 2021-06-26 NOTE — ED Notes (Signed)
Report given to Melissa RN

## 2021-06-26 NOTE — Evaluation (Signed)
Speech Language Pathology Evaluation Patient Details Name: Veronica Wright MRN: [id number] DOB: April 09, 1946 Today's Date: 06/26/2021 Time: 8675-4492 SLP Time Calculation (min) (ACUTE ONLY): 40 min  Problem List:  Patient Active Problem List   Diagnosis Date Noted   Acute CVA (cerebrovascular accident) ([place]) 06/26/2021   HTN (hypertension) 06/26/2021   Past Medical History:  Past Medical History:  Diagnosis Date   Arthritis    Asthma    Depression    High cholesterol    Past Surgical History:  Past Surgical History:  Procedure Laterality Date   CHOLECYSTECTOMY     COLONOSCOPY WITH PROPOFOL N/A 02/12/2017   Procedure: COLONOSCOPY WITH PROPOFOL;  Surgeon: Lollie Sails, MD;  Location: [facility] ENDOSCOPY;  Service: Endoscopy;  Laterality: N/A;   ESOPHAGOGASTRODUODENOSCOPY (EGD) WITH PROPOFOL N/A 02/12/2017   Procedure: ESOPHAGOGASTRODUODENOSCOPY (EGD) WITH PROPOFOL;  Surgeon: Lollie Sails, MD;  Location: [facility] ENDOSCOPY;  Service: Endoscopy;  Laterality: N/A;   HPI:  Per 56 H&P "Veronica Wright is a 75 y.o. female with medical history significant for HTN who presents to the ED as a code stroke after presenting with slurred speech and left lower extremity weakness, onset 1800 on 10/30.  NIHSS on arrival was 3.  She received CT head per code stroke protocol that showed no acute pathology or hemorrhage.  She was seen by teleneurology who found her symptoms to be in keeping with an left MCA infarct .  CTA showed no intervene able LVO.  Patient was outside the tPA window."   Assessment / Plan / Recommendation Clinical Impression  Pt seen for speech/language evaluation via informal means and portions of Western Aphasia Battery Revised (Bedside Record Form). Pt presents with s/sx anomic aphasia c/b mainly fluent speech with some hesitations and wordfinding difficulty as well as reduced auditory comprehension for complex yes/no questions and complex 2-step commands. Pt's basic  functional reading and writing appeared [facility]. Pt with emerging use of compensatory strategies for anomia including circumlocution and synonym/antonym use. A full cognitive assessment was not completed at this time due to aphasia. However, reduced self-monitoring/error awareness noted during clockdrawing task.   Based on today's assessment anticipate need for initial 24 hour supervision/assistance for iADLs and SLP services during this admisssion and at d/c. SLP to f/u per POC for continued SLP services.   Pt and son educated re: role of SLP, aphasia, domains of language, results of assessment, d/c recommendations, basic communication strategies for anomia, and SLP POC. Pt and son verbalized understanding/agreement. RN made aware of results, recommendations, and POC.    SLP Assessment  SLP Recommendation/Assessment: Patient needs continued Speech [place] [facility] SLP Visit Diagnosis: Cognitive communication deficit (R41.841)    Recommendations for follow up therapy are one component of a multi-disciplinary discharge planning process, led by the attending physician.  Recommendations may be updated based on patient status, additional functional criteria and insurance authorization.    Follow Up Recommendations  24 hour supervision/assistance;Outpatient SLP (initial 24 hour supervision/assistance at d/c)    Frequency and Duration min 2x/week  2 weeks      SLP Evaluation Cognition  Overall Cognitive Status: Impaired/Different from baseline Arousal/Alertness: Awake/alert Orientation Level: Oriented X4 Problem Solving: Impaired Problem Solving Impairment:  (non-verbal; clockdrawing) Executive Function: Self Monitoring;Self Correcting;Sequencing;Organizing Sequencing: Appears intact Organizing: Appears intact Self Monitoring: Impaired Self Monitoring Impairment: Functional basic Self Correcting: Impaired Self Correcting Impairment: Functional basic       Comprehension  Auditory  Comprehension Overall Auditory Comprehension: Impaired Yes/No Questions: Impaired Complex Questions: 75-100% accurate Commands:  Impaired Complex Commands: 75-100% accurate Other Conversation Comments: required occasional repetition of auditory stimuli Interfering Components: Hearing;Attention EffectiveTechniques: Repetition;Increased volume;Extra processing time Visual Recognition/Discrimination Discrimination: Within Function Limits Reading Comprehension Reading Status: Within funtional limits (for clockdrawing)    Expression Expression Primary Mode of Expression: Verbal Verbal Expression Overall Verbal Expression: Impaired Initiation: No impairment Automatic Speech: Name;Social Response Repetition: No impairment Naming: No impairment Responsive:  (WFL) Confrontation:  (WFL; extra tima) Convergent:  (WFL) Divergent:  (WFL) Pragmatics: No impairment Written Expression Dominant Hand: Right Written Expression: Within Functional Limits (for clockdrawing)   Oral / Motor  Oral Motor/Sensory Function Overall Oral Motor/Sensory Function: Within functional limits Motor Speech Overall Motor Speech: Appears within functional limits for tasks assessed Respiration: Within functional limits Resonance: Within functional limits Articulation: Within functional limitis Intelligibility: Intelligible Motor Planning: Witnin functional limits   Cherrie Gauze, M.S., CCC-SLP                    Quintella Baton 06/26/2021, 12:34 PM

## 2021-06-26 NOTE — ED Notes (Signed)
Lunch tray delivered to pt at this time.

## 2021-06-26 NOTE — ED Notes (Signed)
Pt transported back to room from MRI at this time.

## 2021-06-26 NOTE — ED Notes (Signed)
Meal tray at bedside.  

## 2021-06-26 NOTE — Progress Notes (Signed)
Anticoagulation monitoring(Lovenox):  75 yo female ordered Lovenox 40 mg Q24h    Filed Weights   06/25/21 2238  Weight: 83.9 kg (185 lb)   BMI 36    Lab Results  Component Value Date   CREATININE 0.96 06/25/2021   CREATININE 0.74 06/01/2021   Estimated Creatinine Clearance: 48.7 mL/min (by C-G formula based on SCr of 0.96 mg/dL). Hemoglobin & Hematocrit     Component Value Date/Time   HGB 14.0 06/25/2021 2300   HCT 41.2 06/25/2021 2300     Per Protocol for Patient with estCrcl >30 ml/min and BMI > 30, will transition to Lovenox 42.5 mg Q24h.

## 2021-06-26 NOTE — Evaluation (Signed)
Physical Therapy Evaluation Patient Details Name: Veronica Wright MRN: [id number] DOB: [birth year] Today's Date: 06/26/2021  History of Present Illness  Pt is a 75 y/o F admitted on 06/25/21 with c/c of slurred speech & LLE weakness. CT showed no acute pathology or hemorrhage.  She was seen by teleneurology who found her symptoms to be in keeping with an left MCA infarct. Pt was outside the tPA window. MRI was negative for acute processes. PMH: HTN, arthritis, asthma, depression, high cholesterol  Clinical Impression  Pt seen for PT evaluation with son present. Prior to admission pt lived alone in a level entry apartment but does endorse falling in the past 6 months (walk walking her dog); pt was independent without AD & still driving. On this date pt was able to perform bed mobility with mod I, STS with mod I, and ambulate in room/bathroom and hallway without AD with independence. Pt did experience 1 LOB with min assist to correct when distracted by something while continuing to ambulate. At this time, pt appears to be at her baseline level of function & does not require acute PT services. PT to sign off at this time, please re-consult if new needs arise.        Recommendations for follow up therapy are one component of a multi-disciplinary discharge planning process, led by the attending physician.  Recommendations may be updated based on patient status, additional functional criteria and insurance authorization.  Follow Up Recommendations No PT follow up    Assistance Recommended at Discharge PRN  Functional Status Assessment Patient has not had a recent decline in their functional status  Equipment Recommendations  None recommended by PT    Recommendations for Other Services       Precautions / Restrictions Precautions Precautions: None Restrictions Weight Bearing Restrictions: No      Mobility  Bed Mobility Overal bed mobility: Modified Independent             General bed  mobility comments: supine<>sit with HOB elevated    Transfers Overall transfer level: Modified independent   Transfers: Sit to/from Stand Sit to Stand: Modified independent (Device/Increase time)           General transfer comment: sit<>stand from toilet mod I    Ambulation/Gait Ambulation/Gait assistance: Modified independent (Device/Increase time) Gait Distance (Feet): 300 Feet Assistive device: None   Gait velocity: WNL   General Gait Details: WNL, 1 episode of LOB while continuing to walk but turned back & looking at something  Stairs            Wheelchair Mobility    Modified Rankin (Stroke Patients Only)       Balance Overall balance assessment: Needs assistance   Sitting balance-Leahy Scale: Normal     Standing balance support: No upper extremity supported;During functional activity Standing balance-Leahy Scale: Good                               Pertinent Vitals/Pain Pain Assessment: No/denies pain    Home Living Family/patient expects to be discharged to:: Private residence Living Arrangements: Alone Available Help at Discharge: Family;Available PRN/intermittently Type of Home: Apartment Home Access: Level entry       Home Layout: One level        Prior Function Prior Level of Function : Independent/Modified Independent             Mobility Comments: Independent without AD, 1 fall in the past  6 months while walking dog, driving, lives alone       Hand Dominance        Extremity/Trunk Assessment   Upper Extremity Assessment Upper Extremity Assessment: Overall WFL for tasks assessed    Lower Extremity Assessment Lower Extremity Assessment: Overall WFL for tasks assessed       Communication      Cognition Arousal/Alertness: Awake/alert Behavior During Therapy: WFL for tasks assessed/performed Overall Cognitive Status: Within Functional Limits for tasks assessed                                  General Comments: Very pleasant lady, eager to walk.        General Comments      Exercises     Assessment/Plan    PT Assessment Patient does not need any further PT services  PT Problem List         PT Treatment Interventions      PT Goals (Current goals can be found in the Care Plan section)  Acute Rehab PT Goals Patient Stated Goal: return home/to PLOF PT Goal Formulation: With patient/family Time For Goal Achievement: 07/10/21 Potential to Achieve Goals: Good    Frequency     Barriers to discharge        Co-evaluation               AM-PAC PT "6 Clicks" Mobility  Outcome Measure Help needed turning from your back to your side while in a flat bed without using bedrails?: None Help needed moving from lying on your back to sitting on the side of a flat bed without using bedrails?: None Help needed moving to and from a bed to a chair (including a wheelchair)?: None Help needed standing up from a chair using your arms (e.g., wheelchair or bedside chair)?: None Help needed to walk in hospital room?: None Help needed climbing 3-5 steps with a railing? : A Little 6 Click Score: 23    End of Session   Activity Tolerance: Patient tolerated treatment well Patient left: in bed;with family/visitor present (in care of SLP)        Time: 1610-9604 PT Time Calculation (min) (ACUTE ONLY): 14 min   Charges:   PT Evaluation $PT Eval Low Complexity: 1 Low          Lavone Nian, PT, DPT 06/26/21, 11:35 AM   Waunita Schooner 06/26/2021, 11:33 AM

## 2021-06-26 NOTE — H&P (Signed)
History and Physical    Veronica Wright HYI:[id number] DOB: 1946-05-31 DOA: 06/25/2021  PCP: Becky Augusta, PA-C   Patient coming from: home  I have personally briefly reviewed patient's relevant medical records in [place]  Chief Complaint: slurred speech and left leg weakness  HPI: Veronica Wright is a 75 y.o. female with medical history significant for HTN who presents to the ED as a code stroke after presenting with slurred speech and left lower extremity weakness, onset 1800 on 10/30.  NIHSS on arrival was 3.  She received CT head per code stroke protocol that showed no acute pathology or hemorrhage.  She was seen by teleneurology who found her symptoms to be in keeping with an left MCA infarct .  CTA showed no intervene able LVO.  Patient was outside the tPA window.  ED course: Essentially as above.  Vitals were unremarkable, as well as blood work except for mildly depressed potassium of 3.3  Imaging: As described in HPI  EKG, personally viewed and interpreted: NSR at 97 with nonspecific ST-T wave changes  Hospitalist consulted for admission for stroke work-up as recommended by teleneurology    Review of Systems: As per HPI otherwise all other systems on review of systems negative.    Past Medical History:  Diagnosis Date   Arthritis    Asthma    Depression    High cholesterol     Past Surgical History:  Procedure Laterality Date   CHOLECYSTECTOMY     COLONOSCOPY WITH PROPOFOL N/A 02/12/2017   Procedure: COLONOSCOPY WITH PROPOFOL;  Surgeon: Lollie Sails, MD;  Location: [facility] ENDOSCOPY;  Service: Endoscopy;  Laterality: N/A;   ESOPHAGOGASTRODUODENOSCOPY (EGD) WITH PROPOFOL N/A 02/12/2017   Procedure: ESOPHAGOGASTRODUODENOSCOPY (EGD) WITH PROPOFOL;  Surgeon: Lollie Sails, MD;  Location: [facility] ENDOSCOPY;  Service: Endoscopy;  Laterality: N/A;     reports that she has never smoked. She has never used smokeless tobacco. She reports that she does not drink  alcohol and does not use drugs.  Allergies  Allergen Reactions   Penicillins     Family History  Problem Relation Age of Onset   Breast cancer Sister 67      Prior to Admission medications   Medication Sig Start Date End Date Taking? Authorizing Provider  albuterol (PROVENTIL HFA;VENTOLIN HFA) 108 (90 BASE) MCG/ACT inhaler Inhale 2 puffs into the lungs every 15 (fifteen) minutes as needed for wheezing or shortness of breath. 03/27/15   Menshew, Dannielle Karvonen, PA-C  budesonide-formoterol (SYMBICORT) 160-4.5 MCG/ACT inhaler Inhale 2 puffs into the lungs as needed.    [provider]  citalopram (CELEXA) 20 MG tablet Take 20 mg by mouth daily.    [provider]  Fluticasone-Salmeterol (ADVAIR) 250-50 MCG/DOSE AEPB Inhale 1 puff into the lungs as needed.    [provider]  predniSONE (DELTASONE) 10 MG tablet Take 1 tablet (10 mg total) by mouth 2 (two) times daily with a meal. Patient not taking: Reported on 02/12/2017 03/27/15   Menshew, Dannielle Karvonen, PA-C  triamcinolone (NASACORT) 55 MCG/ACT AERO nasal inhaler Place 2 sprays into the nose as needed.    [provider]  zolpidem (AMBIEN) 10 MG tablet Take 10 mg by mouth at bedtime.    [provider]    Physical Exam: Vitals:   06/25/21 2231 06/25/21 2238 06/25/21 2345 06/26/21 0239  BP:   130/73 (!) 123/59  Pulse:   76 70  Resp:   20 17  Temp:  98.7 F (37.1 C)  TempSrc:    Oral  SpO2: 98%  98% 98%  Weight:  83.9 kg    Height:  5' (1.524 m)     Constitutional: Alert and oriented x 3 . Not in any apparent distress HEENT:      Head: Normocephalic and atraumatic.         Eyes: PERLA, EOMI, Conjunctivae are normal. Sclera is non-icteric.       Mouth/Throat: Mucous membranes are moist.       Neck: Supple with no signs of meningismus. Cardiovascular: Regular rate and rhythm. No murmurs, gallops, or rubs. 2+ symmetrical distal pulses are present . No JVD. No  LE edema Respiratory:  Respiratory effort normal .Lungs sounds clear bilaterally. No wheezes, crackles, or rhonchi.  Gastrointestinal: Soft, non tender, non distended. Positive bowel sounds.  Genitourinary: No CVA tenderness. Musculoskeletal: Nontender with normal range of motion in all extremities. No cyanosis, or erythema of extremities. Neurologic:  Face is symmetric.  Expressive aphasia with 5-5 strength left lower extremity skin: Skin is warm, dry.  No rash or ulcers Psychiatric: Mood and affect are appropriate    Labs on Admission: I have personally reviewed following labs and imaging studies  CBC: Recent Labs  Lab 06/25/21 2300  WBC 6.9  NEUTROABS 4.2  HGB 14.0  HCT 41.2  MCV 81.1  PLT 607   Basic Metabolic Panel: Recent Labs  Lab 06/25/21 2300  NA 139  K 3.3*  CL 102  CO2 29  GLUCOSE 108*  BUN 16  CREATININE 0.96  CALCIUM 9.2   GFR: Estimated Creatinine Clearance: 48.7 mL/min (by C-G formula based on SCr of 0.96 mg/dL). Liver Function Tests: Recent Labs  Lab 06/25/21 2300  AST 32  ALT 21  ALKPHOS 89  BILITOT 1.4*  PROT 7.1  ALBUMIN 3.4*   Recent Labs  Lab 06/25/21 2300  LIPASE 33   No results for input(s): AMMONIA in the last 168 hours. Coagulation Profile: No results for input(s): INR, PROTIME in the last 168 hours. Cardiac Enzymes: No results for input(s): CKTOTAL, CKMB, CKMBINDEX, TROPONINI in the last 168 hours. BNP (last 3 results) No results for input(s): PROBNP in the last 8760 hours. HbA1C: No results for input(s): HGBA1C in the last 72 hours. CBG: Recent Labs  Lab 06/25/21 2320  GLUCAP 101*   Lipid Profile: No results for input(s): CHOL, HDL, LDLCALC, TRIG, CHOLHDL, LDLDIRECT in the last 72 hours. Thyroid Function Tests: No results for input(s): TSH, T4TOTAL, FREET4, T3FREE, THYROIDAB in the last 72 hours. Anemia Panel: No results for input(s): VITAMINB12, FOLATE, FERRITIN, TIBC, IRON, RETICCTPCT in the last 72 hours. Urine analysis:    Component  Value Date/Time   COLORURINE YELLOW (A) 12/12/2016 1749   APPEARANCEUR HAZY (A) 12/12/2016 1749   LABSPEC 1.024 12/12/2016 1749   PHURINE 5.0 12/12/2016 1749   GLUCOSEU NEGATIVE 12/12/2016 1749   HGBUR NEGATIVE 12/12/2016 1749   BILIRUBINUR NEGATIVE 12/12/2016 1749   KETONESUR NEGATIVE 12/12/2016 1749   PROTEINUR 30 (A) 12/12/2016 1749   NITRITE NEGATIVE 12/12/2016 1749   LEUKOCYTESUR LARGE (A) 12/12/2016 1749    Radiological Exams on Admission: No results found.  Assessment/Plan    Acute CVA (cerebrovascular accident) -75 year old female presenting as code stroke, NIHSS of 3, initial CT negative not tPA candidate by teleneurology, CTA Per teleneurology read with no intervene able LVO - N.p.o. until swallow eval - Permissive hypertension to systolic 371 with as needed labetalol, Vasotec if> 220 - Euglycemia and avoid hyperthermia -  Aspirin 325 given in ED to continue at 81 mg daily - Lipitor 40 mg daily - HB A1c, lipid panel and TSH - Acute stroke stroke work-up to include MRI, echo, continuous cardiac monitoring - PT OT and speech and TOC consults - N.p.o. until bedside swallow eval - Neurology consult to follow    HTN (hypertension) - Permissive hypertension as outlined above    DVT prophylaxis: Lovenox  Code Status: full code  Family Communication:  son in room Disposition Plan: Back to previous home environment Consults called: Neurology Status:At the time of admission, it appears that the appropriate admission status for this patient is INPATIENT. This is judged to be reasonable and necessary in order to provide the required intensity of service to ensure the patient's safety given the presenting symptoms, physical exam findings, and initial radiographic and laboratory data in the context of their  Comorbid conditions.   Patient requires inpatient status due to high intensity of service, high risk for further deterioration and high frequency of surveillance required.    I certify that at the point of admission it is my clinical judgment that the patient will require inpatient hospital care spanning beyond [place] MD [facility]   06/26/2021, 3:15 AM

## 2021-06-26 NOTE — ED Triage Notes (Signed)
Pt to ED via POV with son at side with initial c/o CP. Pt in tears stating "I dont know whats wrong but my chest". This RN noticed pt exhibiting expressive aphasia. Pt's son st that his mother called him around 22:30 c/o unbearable CP when he noticed her speech and thought process was "off". Pt reports sx of expressive aphasia since 18:30 on 06/25/21 when she was speaking with the pharmacist attempting to cancel a medication refill at the [facility]. Pt and son both report the expressive aphasia to be new with est. LKW time of 18:29  MD Tamala Julian notified. Pt called a code stroke and taken to CT by this RN. Charge RN [place] made aware.

## 2021-06-26 NOTE — ED Notes (Signed)
Pt transported to MRI via stretcher at this time.  °

## 2021-06-26 NOTE — Progress Notes (Signed)
OT Cancellation Note  Patient Details Name: Veronica Wright MRN: [id number] DOB: 1945/09/29   Cancelled Treatment:    Reason Eval/Treat Not Completed: OT screened, no needs identified, will sign off. Order received, chart reviewed. Upon arrival pt ambulating in hallway with PT. Pt reports at functional baseline for ADLs and mobility, supportive family at bedside. No skilled acute OT needs identified. Will sign off. Please re-consult if additional needs arise.   Dessie Coma, M.S. OTR/L  06/26/21, 11:33 AM  ascom [phone]

## 2021-06-27 DIAGNOSIS — R4701 Aphasia: Secondary | ICD-10-CM | POA: Diagnosis not present

## 2021-06-27 DIAGNOSIS — G459 Transient cerebral ischemic attack, unspecified: Secondary | ICD-10-CM | POA: Diagnosis not present

## 2021-06-27 DIAGNOSIS — R471 Dysarthria and anarthria: Secondary | ICD-10-CM | POA: Insufficient documentation

## 2021-06-27 DIAGNOSIS — I10 Essential (primary) hypertension: Secondary | ICD-10-CM | POA: Diagnosis not present

## 2021-06-27 MED ORDER — ASPIRIN 81 MG PO TBEC: 81.0000 mg | DELAYED_RELEASE_TABLET | Freq: Every day | ORAL | 11 refills | Status: DC

## 2021-06-27 MED ORDER — ATORVASTATIN CALCIUM 40 MG PO TABS: 40.0000 mg | ORAL_TABLET | Freq: Every day | ORAL | 0 refills | Status: DC

## 2021-06-27 MED ORDER — CLOPIDOGREL BISULFATE 75 MG PO TABS: 75.0000 mg | ORAL_TABLET | Freq: Every day | ORAL | 0 refills | Status: DC

## 2021-06-27 NOTE — Care Management CC44 (Signed)
Condition Code 44 Documentation Completed  Patient Details  Name: Veronica Wright MRN: [id number] Date of Birth: [birth year]   Condition Code 44 given:  Yes Patient signature on Condition Code 44 notice:  Yes Documentation of 2 MD's agreement:  Yes Code 44 added to claim:  Yes    Adelene Amas, [place] 06/27/2021, 8:28 AM

## 2021-06-27 NOTE — Consult Note (Addendum)
NEURO HOSPITALIST CONSULT NOTE   Requesting physician: Dr. Reesa Chew  Reason for Consult: Expressive aphasia  History obtained from:  Patient and Chart     HPI:                                                                                                                                          Veronica Wright is an 75 y.o. female with a PMHx of hypercholesterolemia who presented to the ED on Sunday night with a c/c of CP. When the patient tried to describe her pain, RN noted expressive aphasia. When the son was asked for details, he stated that his mother had called him at about complaints of severe unbearable chest pain and at that time he also first noticed that her speech and thought processes were "off". He states that she had significant hesitancy with trouble finding the words to say, but seemed to understand all that was said to her. The patient first noted her symptoms at 6:30 PM on Sunday when she was speaking with a Pharmacist at [employer]. LKN was 6:29 PM. The patient appeared to be frustrated by her speech deficit.   Per EDP note: "Her symptoms are waxing and waning in the emergency department and occasionally she is able to speak clearly and coherently and then she will either stutter or not be able to find words with the wrong words will come out.  She has also had slurring of her speech."  Her LLE was noted to be weak by the ED physician, but the patient seemed not to be aware of any weakness of that limb.   A Code Stroke was called and Teleneurology consulted. CT head showed no acute abnormality. Per Teleneurologist's assessment: "This is a 75 y.o. female with HTN who presents for slurred speech since 1800 on 10/30. She also on exam had left leg weakness. She denies past stroke. She does not take blood thinners. NIHSS is 3 on arrival. She is hypertensive on arrival as well. Exam suggests possible right hemisphere stroke, possible internal capsule. She did,  however, report wordfinding issues to others in the ED, though did not report this to me, so could consider L MCA territory as well. She was outside the window for tPA. The CTA did not show an interveneable LVO. The vertebral artery is not intervenable given good flow in the dominant vertebral artery and basilar. Management herein."  The patient was started on ASA and atorvastatin and an MRI brain was ordered, in addition to a TTE.   CTA of head and neck: No LVO. 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 A dominant right vertebral artery is widely patent. Predominant fetal type origin of the PCAs bilaterally with  overall diminutive vertebrobasilar system.  Past Medical History:  Diagnosis Date   Arthritis    Asthma    Depression    High cholesterol     Past Surgical History:  Procedure Laterality Date   CHOLECYSTECTOMY     COLONOSCOPY WITH PROPOFOL N/A 02/12/2017   Procedure: COLONOSCOPY WITH PROPOFOL;  Surgeon: Lollie Sails, MD;  Location: [facility] ENDOSCOPY;  Service: Endoscopy;  Laterality: N/A;   ESOPHAGOGASTRODUODENOSCOPY (EGD) WITH PROPOFOL N/A 02/12/2017   Procedure: ESOPHAGOGASTRODUODENOSCOPY (EGD) WITH PROPOFOL;  Surgeon: Lollie Sails, MD;  Location: [facility] ENDOSCOPY;  Service: Endoscopy;  Laterality: N/A;    Family History  Problem Relation Age of Onset   Breast cancer Sister 35              Social History:  reports that she has never smoked. She has never used smokeless tobacco. She reports that she does not drink alcohol and does not use drugs.  Allergies  Allergen Reactions   Penicillins Hives and Swelling    Other reaction(s): Other (See Comments)   Prednisone Other (See Comments)    Sensitivity     HOME MEDICATIONS:                                                                                                                      No current facility-administered medications  on file prior to encounter.   Current Outpatient Medications on File Prior to Encounter  Medication Sig Dispense Refill   citalopram (CELEXA) 20 MG tablet Take 20 mg by mouth daily.     lisinopril (ZESTRIL) 10 MG tablet Take 10 mg by mouth daily.     zolpidem (AMBIEN) 10 MG tablet Take 10 mg by mouth at bedtime.     albuterol (PROVENTIL HFA;VENTOLIN HFA) 108 (90 BASE) MCG/ACT inhaler Inhale 2 puffs into the lungs every 15 (fifteen) minutes as needed for wheezing or shortness of breath. 1 Inhaler 0   budesonide-formoterol (SYMBICORT) 160-4.5 MCG/ACT inhaler Inhale 2 puffs into the lungs as needed.     Fluticasone-Salmeterol (ADVAIR) 250-50 MCG/DOSE AEPB Inhale 1 puff into the lungs as needed. (Patient not taking: No sig reported)     predniSONE (DELTASONE) 10 MG tablet Take 1 tablet (10 mg total) by mouth 2 (two) times daily with a meal. (Patient not taking: No sig reported) 10 tablet 0   triamcinolone (NASACORT) 55 MCG/ACT AERO nasal inhaler Place 2 sprays into the nose as needed.       ROS:  As per HPI. Comprehensive ROS otherwise negative.    Blood pressure 107/68, pulse 65, temperature 98.7 F (37.1 C), temperature source Oral, resp. rate 18, height 5' (1.524 m), weight 83.9 kg, SpO2 94 %.   General Examination:                                                                                                       Physical Exam  HEENT-  /AT   Lungs- Respirations unlabored Extremities- No edema  Neurological Examination Mental Status: Awake and alert. Fully oriented to location, circumstance and time. Pleasant and cooperative. Good eye contact. Speech fluent without hesitancy or errors of grammar or syntax. Intact naming of common and uncommon words. Intact comprehension. Follows all commands normally. Did make one error on first attempt to spell WORLD  backwards, but then gave the correct letter sequence.  Cranial Nerves: II: Temporal visual fields intact with no extinction to DSS. PERRL.   III,IV, VI: No ptosis. Eyes conjugate. EOMI. No nystagmus.  R V: Temp sensation intact bilaterally VII: Smile symmetric VIII: Hearing intact to voice IX,X: No hypophonia or hoarseness XI: Symmetric shoulder shrug XII: Midline tongue extension Motor: RUE: 5/5 RLE: 5/5 LUE: 5/5 LLE: 4+/5 in association with some knee pain.  No pronator drift Sensory: Temp and light touch intact throughout, bilaterally. No extinction to DSS.  Deep Tendon Reflexes: 2+ and symmetric throughout Plantars: Right: downgoing   Left: downgoing Cerebellar: No ataxia with FNF bilaterally.  Gait: Deferred   Lab Results: Basic Metabolic Panel: Recent Labs  Lab 06/25/21 2300 06/26/21 0539 06/26/21 1153  NA 139  --  135  K 3.3*  --  3.3*  CL 102  --  103  CO2 29  --  26  GLUCOSE 108*  --  183*  BUN 16  --  15  CREATININE 0.96 1.08* 0.86  CALCIUM 9.2  --  8.7*  MG  --   --  1.9  PHOS  --   --  3.1    CBC: Recent Labs  Lab 06/25/21 2300 06/26/21 0539 06/26/21 1153  WBC 6.9 7.2 5.2  NEUTROABS 4.2  --   --   HGB 14.0 14.3 12.5  HCT 41.2 41.6 37.2  MCV 81.1 81.7 81.9  PLT 204 192 178    Cardiac Enzymes: No results for input(s): CKTOTAL, CKMB, CKMBINDEX, TROPONINI in the last 168 hours.  Lipid Panel: Recent Labs  Lab 06/26/21 0539  CHOL 158  TRIG 124  HDL 22*  CHOLHDL 7.2  VLDL 25  LDLCALC 111*    Imaging: CT ANGIO HEAD NECK W WO CM  Result Date: 06/26/2021 CLINICAL DATA:  Initial evaluation for acute stroke, aphasia. EXAM: CT ANGIOGRAPHY HEAD AND NECK TECHNIQUE: Multidetector CT imaging of the head and neck was performed using the standard protocol during bolus administration of intravenous contrast. Multiplanar CT image reconstructions and MIPs were obtained to evaluate the vascular anatomy. Carotid stenosis measurements (when applicable) are  obtained utilizing NASCET criteria, using the distal internal carotid diameter as the denominator. CONTRAST:  68mL OMNIPAQUE IOHEXOL 350 MG/ML  SOLN COMPARISON:  Prior head CT from earlier the same day. FINDINGS: CTA NECK FINDINGS Aortic arch: Visualized aortic arch normal in caliber with normal 3 vessel morphology. Mild atheromatous change about the arch itself. No hemodynamically significant stenosis about the origin of the great vessels. Right carotid system: Right common and internal carotid arteries widely patent without stenosis, dissection or occlusion. Left carotid system: Left common and internal carotid arteries widely patent without stenosis, dissection or occlusion. Vertebral arteries: Both vertebral arteries arise from the subclavian arteries. Right vertebral artery strongly dominant and patent within the neck without stenosis, dissection or occlusion. Left vertebral artery markedly hypoplastic and is patent proximally, but occludes at the distal V2 segment. Distal reconstitution at the V3 segment, with the hypoplastic vertebral artery patent as it courses into the cranial vault. Skeleton: No discrete or worrisome osseous lesions. Mild-to-moderate multilevel cervical spondylosis at C4-5 through C6-7. Other neck: No other acute soft tissue abnormality within the neck. Upper chest: Visualized upper chest demonstrates no acute finding. Review of the MIP images confirms the above findings CTA HEAD FINDINGS Anterior circulation: Both internal carotid arteries widely patent to the termini without stenosis. A1 segments widely patent. Normal anterior communicating artery complex. Both anterior cerebral arteries widely patent to their distal aspects without stenosis. No M1 stenosis or occlusion. Normal MCA bifurcations. Distal MCA branches well perfused and symmetric. Posterior circulation: Dominant right V4 segment widely patent to the vertebrobasilar junction. Hypoplastic left vertebral artery widely patent as  well. Both PICA origins patent. Basilar mildly diminutive but is patent to its distal aspect without stenosis. Superior cerebral arteries patent bilaterally. Predominant fetal type origin of the PCAs bilaterally supplied via a robust bilateral posterior communicating arteries. Both PCAs well perfused or distal aspects. Venous sinuses: Patent allowing for timing the contrast bolus. Anatomic variants: Predominant fetal type origin of the PCAs. Markedly hypoplastic left vertebral artery. No aneurysm. Review of the MIP images confirms the above findings IMPRESSION: 1. Negative CTA for emergent large vessel occlusion. 2. 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 Dominant right vertebral artery widely patent. 3. Predominant fetal type origin of the PCAs bilaterally with overall diminutive vertebrobasilar system. 4. Otherwise wide patency of the major arterial vasculature of the head and neck. No other hemodynamically significant or correctable stenosis. Electronically Signed   By: Jeannine Boga M.D.   On: 06/26/2021 01:35   MR BRAIN WO CONTRAST  Result Date: 06/26/2021 CLINICAL DATA:  Chest pain and headache that started last night EXAM: MRI HEAD WITHOUT CONTRAST TECHNIQUE: Multiplanar, multiecho pulse sequences of the brain and surrounding structures were obtained without intravenous contrast. COMPARISON:  CTA from earlier the same day FINDINGS: Brain: No acute infarction, hemorrhage, hydrocephalus, extra-axial collection or mass lesion. Vascular: Normal flow voids. Skull and upper cervical spine: Normal marrow signal. Sinuses/Orbits: Negative. IMPRESSION: Normal brain MRI Electronically Signed   By: Jorje Guild M.D.   On: 06/26/2021 05:48   ECHOCARDIOGRAM COMPLETE  Result Date: 06/26/2021    ECHOCARDIOGRAM REPORT   Patient Name:   Veronica Wright Date of Exam: 06/26/2021 Medical Rec #:  [id number]     Height:       60.0  in Accession #:    [id number]    Weight:       185.0 lb Date of Birth:  05/28/1946     BSA:          1.806 m Patient Age:    30 years  BP:           120/77 mmHg Patient Gender: F             HR:           72 bpm. Exam Location:  [facility] Procedure: 2D Echo, Cardiac Doppler and Color Doppler Indications:     Stroke I63.9  History:         Patient has no prior history of Echocardiogram examinations.                  High cholesterol.  Sonographer:     Sherrie Sport Referring Phys:  [id number] Athena Masse Diagnosing Phys: Nelva Bush MD  Sonographer Comments: Technically challenging study due to limited acoustic windows and suboptimal apical window. IMPRESSIONS  1. Left ventricular ejection fraction, by estimation, is >55%. The left ventricle has normal function. Left ventricular endocardial border not optimally defined to evaluate regional wall motion. There is moderate left ventricular hypertrophy. Left ventricular diastolic parameters are consistent with Grade II diastolic dysfunction (pseudonormalization). Elevated left atrial pressure.  2. Right ventricular systolic function is normal. The right ventricular size is normal. Tricuspid regurgitation signal is inadequate for assessing PA pressure.  3. Left atrial size was mildly dilated.  4. The mitral valve is grossly normal. Trivial mitral valve regurgitation. No evidence of mitral stenosis.  5. The aortic valve is tricuspid. Aortic valve regurgitation is not visualized. No aortic stenosis is present.  6. The inferior vena cava is normal in size with greater than 50% respiratory variability, suggesting right atrial pressure of 3 mmHg. FINDINGS  Left Ventricle: Left ventricular ejection fraction, by estimation, is >55%. The left ventricle has normal function. Left ventricular endocardial border not optimally defined to evaluate regional wall motion. The left ventricular internal cavity size was  normal in size. There is moderate left ventricular hypertrophy. Left  ventricular diastolic parameters are consistent with Grade II diastolic dysfunction (pseudonormalization). Elevated left atrial pressure. Right Ventricle: The right ventricular size is normal. No increase in right ventricular wall thickness. Right ventricular systolic function is normal. Tricuspid regurgitation signal is inadequate for assessing PA pressure. Left Atrium: Left atrial size was mildly dilated. Right Atrium: Right atrial size was normal in size. Pericardium: The pericardium was not well visualized. Mitral Valve: The mitral valve is grossly normal. Trivial mitral valve regurgitation. No evidence of mitral valve stenosis. MV peak gradient, 3.2 mmHg. The mean mitral valve gradient is 1.0 mmHg. Tricuspid Valve: The tricuspid valve is normal in structure. Tricuspid valve regurgitation is trivial. Aortic Valve: The aortic valve is tricuspid. Aortic valve regurgitation is not visualized. No aortic stenosis is present. Aortic valve mean gradient measures 2.0 mmHg. Aortic valve peak gradient measures 5.7 mmHg. Aortic valve area, by VTI measures 5.39 cm. Pulmonic Valve: The pulmonic valve was not well visualized. Pulmonic valve regurgitation is not visualized. No evidence of pulmonic stenosis. Aorta: The aortic root is normal in size and structure. Pulmonary Artery: The pulmonary artery is not well seen. Venous: The inferior vena cava is normal in size with greater than 50% respiratory variability, suggesting right atrial pressure of 3 mmHg. IAS/Shunts: The interatrial septum was not well visualized.  LEFT VENTRICLE PLAX 2D LVIDd:         4.40 cm   Diastology LVIDs:         3.20 cm   LV e' medial:    3.48 cm/s LV PW:         1.20 cm   LV E/e'  medial:  21.8 LV IVS:        1.00 cm   LV e' lateral:   9.03 cm/s LVOT diam:     2.60 cm   LV E/e' lateral: 8.4 LV SV:         106 LV SV Index:   59 LVOT Area:     5.31 cm  RIGHT VENTRICLE RV Basal diam:  3.50 cm RV S prime:     16.50 cm/s LEFT ATRIUM           Index         RIGHT ATRIUM           Index LA diam:      3.40 cm 1.88 cm/m   RA Area:     15.50 cm LA Vol (A4C): 72.3 ml 40.04 ml/m  RA Volume:   42.40 ml  23.48 ml/m  AORTIC VALVE                    PULMONIC VALVE AV Area (Vmax):    4.28 cm     PV Vmax:        0.79 m/s AV Area (Vmean):   5.12 cm     PV Vmean:       58.200 cm/s AV Area (VTI):     5.39 cm     PV VTI:         0.170 m AV Vmax:           119.00 cm/s  PV Peak grad:   2.5 mmHg AV Vmean:          67.000 cm/s  PV Mean grad:   2.0 mmHg AV VTI:            0.196 m      RVOT Peak grad: 3 mmHg AV Peak Grad:      5.7 mmHg AV Mean Grad:      2.0 mmHg LVOT Vmax:         96.00 cm/s LVOT Vmean:        64.600 cm/s LVOT VTI:          0.199 m LVOT/AV VTI ratio: 1.02  AORTA Ao Root diam: 3.57 cm MITRAL VALVE MV Area (PHT): 3.13 cm    SHUNTS MV Area VTI:   3.50 cm    Systemic VTI:  0.20 m MV Peak grad:  3.2 mmHg    Systemic Diam: 2.60 cm MV Mean grad:  1.0 mmHg    Pulmonic VTI:  0.163 m MV Vmax:       0.89 m/s MV Vmean:      51.8 cm/s MV Decel Time: 242 msec MV E velocity: 75.80 cm/s MV A velocity: 69.80 cm/s MV E/A ratio:  1.09 Harrell Gave End MD Electronically signed by Nelva Bush MD Signature Date/Time: 06/26/2021/12:37:25 PM    Final    CT HEAD CODE STROKE WO CONTRAST  Result Date: 06/25/2021 CLINICAL DATA:  Code stroke. Initial evaluation for acute stroke, aphasia. EXAM: CT HEAD WITHOUT CONTRAST TECHNIQUE: Contiguous axial images were obtained from the base of the skull through the vertex without intravenous contrast. COMPARISON:  CT from 06/01/2021. FINDINGS: Brain: Cerebral volume within normal limits for patient age. No evidence for acute intracranial hemorrhage. No findings to suggest acute large vessel territory infarct. No mass lesion, midline shift, or mass effect. Ventricles are normal in size without evidence for hydrocephalus. No extra-axial fluid collection identified. Vascular: No hyperdense vessel identified. Skull: Scalp soft tissues demonstrate  no  acute abnormality. Calvarium intact. Sinuses/Orbits: Globes and orbital soft tissues within normal limits. Scattered mucosal thickening noted within the ethmoidal air cells. Paranasal sinuses are otherwise clear. No mastoid effusion. ASPECTS [facility] Stroke Program Early CT Score) - Ganglionic level infarction (caudate, lentiform nuclei, internal capsule, insula, M1-M3 cortex): 7 - Supraganglionic infarction (M4-M6 cortex): 3 Total score (0-10 with 10 being normal): 10 IMPRESSION: 1. Negative head CT. No acute intracranial infarct or other abnormality identified. 2. ASPECTS is 10. Critical Value/emergent results were called by telephone at the time of interpretation on 06/25/2021 at 11:26 pm to provider Dr. Karma Greaser, Who verbally acknowledged these results. Electronically Signed   By: Jeannine Boga M.D.   On: 06/25/2021 23:28     Assessment: 75 year old female presenting with expressive aphasia, now resolved.  1. Exam reveals no speech deficit. LLE with mild weakness in association with left knee pain, which the patient states is chronic.  2. CTA of head and neck: Negative CTA for emergent large vessel occlusion. 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 Dominant right vertebral artery widely patent. Predominant fetal type origin of the PCAs bilaterally with overall diminutive vertebrobasilar system. Otherwise wide patency of the major arterial vasculature of the head and neck.  3. MRI brain: No acute infarction, hemorrhage, hydrocephalus, extra-axial collection or mass lesion. 4. TTE: LVEF > 55%. The left ventricle has normal function. Left ventricular endocardial border not optimally defined to evaluate regional wall motion. Left ventricular diastolic parameters are consistent with Grade II diastolic dysfunction (pseudonormalization). Elevated left atrial pressure. Right ventricular systolic function is normal. The  right ventricular size is normal. No mural thrombus or valvular vegetation mentioned in the report.  5. Overall presentation is most consistent with TIA.   Recommendations: 1. Continue ASA.  2. Start Plavix for DAPT with ASA. Continue Plavix for 21 days, then discontinue, but continue to use ASA daily indefinitely.  3. Agree with starting atorvastatin.  4. PT to clear for discharge.  5. BP management per standard protocol. Out of the permissive HTN time window.  6. Will need outpatient Neurology follow up.     Electronically signed: Dr. Kerney Elbe 06/27/2021, 8:00 AM

## 2021-06-27 NOTE — Discharge Summary (Signed)
Physician Discharge Summary  Veronica Wright MAU:[id number] DOB: 1945-09-17 DOA: 06/25/2021  PCP: Becky Augusta, PA-C  Admit date: 06/25/2021 Discharge date: 06/27/2021  Admitted From: Home Disposition: Home  Recommendations for Outpatient Follow-up:  Follow up with PCP in 1-2 weeks Follow-up with neurology in 1 to 2 weeks Please obtain BMP/CBC in one week Please follow up on the following pending results: None  Home Health: No Equipment/Devices: None Discharge Condition: Stable CODE STATUS: Full Diet recommendation: Heart Healthy   Brief/Interim Summary: Veronica Wright is a 75 y.o. female with medical history significant for HTN who presents to the ED as a code stroke after presenting with slurred speech and left lower extremity weakness, onset 1800 on 10/30.  NIHSS on arrival was 3.  She received CT head per code stroke protocol that showed no acute pathology or hemorrhage.  She was seen by teleneurology who found her symptoms to be in keeping with an left MCA infarct .  CTA showed no intervene able LVO.  Patient was outside the tPA window.  MRI was negative for any acute abnormality.  Neurology was also consulted and patient underwent stroke work-up which did not show any acute finding.  Her symptoms resolved completely spontaneously.  PT and OT also evaluated her and they are not recommending any therapy at this time as patient appears to be at her baseline.  Most likely had a TIA. Of her neurologist recommending DAPT with aspirin and Plavix for 3 weeks followed by aspirin only.  Lipid profile with LDL of 111 with a goal of 70 and below, she was also started on Lipitor.  Patient needs to follow-up with neurology as an outpatient.  She will continue with rest of her home medications and follow-up with her providers.  Discharge Diagnoses:  Active Problems:   Acute CVA (cerebrovascular accident) ([place])   HTN (hypertension)   Discharge Instructions  Discharge Instructions      Diet - low sodium heart healthy   Complete by: As directed    Discharge instructions   Complete by: As directed    It was pleasure taking care of you. You are being started on a cholesterol medicine and low-dose aspirin to decrease the risk of any stroke or heart attack. You can take plavix and aspirin together for 3 weeks and then only continue with aspirin. Continue taking rest of your medications and follow-up with your primary care doctor for further recommendations.   Increase activity slowly   Complete by: As directed       Allergies as of 06/27/2021       Reactions   Penicillins Hives, Swelling   Other reaction(s): Other (See Comments)   Prednisone Other (See Comments)   Sensitivity         Medication List     STOP taking these medications    Fluticasone-Salmeterol 250-50 MCG/DOSE Aepb Commonly known as: ADVAIR   predniSONE 10 MG tablet Commonly known as: DELTASONE       TAKE these medications    albuterol 108 (90 Base) MCG/ACT inhaler Commonly known as: VENTOLIN HFA Inhale 2 puffs into the lungs every 15 (fifteen) minutes as needed for wheezing or shortness of breath.   aspirin 81 MG EC tablet Take 1 tablet (81 mg total) by mouth daily. Swallow whole. Start taking on: June 28, 2021   atorvastatin 40 MG tablet Commonly known as: LIPITOR Take 1 tablet (40 mg total) by mouth daily. Start taking on: June 28, 2021   budesonide-formoterol 160-4.5 MCG/ACT inhaler  Commonly known as: SYMBICORT Inhale 2 puffs into the lungs as needed.   citalopram 20 MG tablet Commonly known as: CELEXA Take 20 mg by mouth daily.   clopidogrel 75 MG tablet Commonly known as: PLAVIX Take 1 tablet (75 mg total) by mouth daily.   lisinopril 10 MG tablet Commonly known as: ZESTRIL Take 10 mg by mouth daily.   triamcinolone 55 MCG/ACT Aero nasal inhaler Commonly known as: NASACORT Place 2 sprays into the nose as needed.   zolpidem 10 MG tablet Commonly known as:  AMBIEN Take 10 mg by mouth at bedtime.        Follow-up Information     Becky Augusta, PA-C. Schedule an appointment as soon as possible for a visit in 1 week(s).   Specialty: Physician Assistant Contact information: [place] [address] [place] [address] [phone]         Vladimir Crofts, MD. Schedule an appointment as soon as possible for a visit in 1 week(s).   Specialty: Neurology Contact information: [facility] [place] [address] [phone]                Allergies  Allergen Reactions   Penicillins Hives and Swelling    Other reaction(s): Other (See Comments)   Prednisone Other (See Comments)    Sensitivity     Consultations: Neurology  Procedures/Studies: CT ANGIO HEAD NECK W WO CM  Result Date: 06/26/2021 CLINICAL DATA:  Initial evaluation for acute stroke, aphasia. EXAM: CT ANGIOGRAPHY HEAD AND NECK TECHNIQUE: Multidetector CT imaging of the head and neck was performed using the standard protocol during bolus administration of intravenous contrast. Multiplanar CT image reconstructions and MIPs were obtained to evaluate the vascular anatomy. Carotid stenosis measurements (when applicable) are obtained utilizing NASCET criteria, using the distal internal carotid diameter as the denominator. CONTRAST:  43mL OMNIPAQUE IOHEXOL 350 MG/ML SOLN COMPARISON:  Prior head CT from earlier the same day. FINDINGS: CTA NECK FINDINGS Aortic arch: Visualized aortic arch normal in caliber with normal 3 vessel morphology. Mild atheromatous change about the arch itself. No hemodynamically significant stenosis about the origin of the great vessels. Right carotid system: Right common and internal carotid arteries widely patent without stenosis, dissection or occlusion. Left carotid system: Left common and internal carotid arteries widely patent without stenosis, dissection or occlusion. Vertebral arteries: Both vertebral  arteries arise from the subclavian arteries. Right vertebral artery strongly dominant and patent within the neck without stenosis, dissection or occlusion. Left vertebral artery markedly hypoplastic and is patent proximally, but occludes at the distal V2 segment. Distal reconstitution at the V3 segment, with the hypoplastic vertebral artery patent as it courses into the cranial vault. Skeleton: No discrete or worrisome osseous lesions. Mild-to-moderate multilevel cervical spondylosis at C4-5 through C6-7. Other neck: No other acute soft tissue abnormality within the neck. Upper chest: Visualized upper chest demonstrates no acute finding. Review of the MIP images confirms the above findings CTA HEAD FINDINGS Anterior circulation: Both internal carotid arteries widely patent to the termini without stenosis. A1 segments widely patent. Normal anterior communicating artery complex. Both anterior cerebral arteries widely patent to their distal aspects without stenosis. No M1 stenosis or occlusion. Normal MCA bifurcations. Distal MCA branches well perfused and symmetric. Posterior circulation: Dominant right V4 segment widely patent to the vertebrobasilar junction. Hypoplastic left vertebral artery widely patent as well. Both PICA origins patent. Basilar mildly diminutive but is patent to its distal aspect without stenosis. Superior cerebral  arteries patent bilaterally. Predominant fetal type origin of the PCAs bilaterally supplied via a robust bilateral posterior communicating arteries. Both PCAs well perfused or distal aspects. Venous sinuses: Patent allowing for timing the contrast bolus. Anatomic variants: Predominant fetal type origin of the PCAs. Markedly hypoplastic left vertebral artery. No aneurysm. Review of the MIP images confirms the above findings IMPRESSION: 1. Negative CTA for emergent large vessel occlusion. 2. 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 Dominant right vertebral artery widely patent. 3. Predominant fetal type origin of the PCAs bilaterally with overall diminutive vertebrobasilar system. 4. Otherwise wide patency of the major arterial vasculature of the head and neck. No other hemodynamically significant or correctable stenosis. Electronically Signed   By: Jeannine Boga M.D.   On: 06/26/2021 01:35   DG Chest 2 View  Result Date: 06/01/2021 CLINICAL DATA:  Chest pain for several hours EXAM: CHEST - 2 VIEW COMPARISON:  03/27/2015 FINDINGS: Cardiac shadow is within normal limits. Lungs are well aerated bilaterally. No focal infiltrate or sizable effusion is noted. Degenerative changes of the thoracic spine are seen. IMPRESSION: No active cardiopulmonary disease. Electronically Signed   By: Inez Catalina M.D.   On: 06/01/2021 02:18   CT HEAD WO CONTRAST (5MM)  Result Date: 06/01/2021 CLINICAL DATA:  Headache. EXAM: CT HEAD WITHOUT CONTRAST TECHNIQUE: Contiguous axial images were obtained from the base of the skull through the vertex without intravenous contrast. COMPARISON:  None. FINDINGS: Brain: There is mild cerebral atrophy with widening of the extra-axial spaces and ventricular dilatation. There are areas of decreased attenuation within the white matter tracts of the supratentorial brain, consistent with microvascular disease changes. Vascular: No hyperdense vessel or unexpected calcification. Skull: Normal. Negative for fracture or focal lesion. Sinuses/Orbits: No acute finding. Other: None. IMPRESSION: 1. Generalized cerebral atrophy. 2. No acute intracranial abnormality. Electronically Signed   By: Virgina Norfolk M.D.   On: 06/01/2021 03:35   CT Angio Chest PE W and/or Wo Contrast  Result Date: 06/01/2021 CLINICAL DATA:  75 year old female with history of chest pain and headache. Positive D-dimer. Evaluate for pulmonary embolism. EXAM: CT ANGIOGRAPHY CHEST WITH CONTRAST TECHNIQUE:  Multidetector CT imaging of the chest was performed using the standard protocol during bolus administration of intravenous contrast. Multiplanar CT image reconstructions and MIPs were obtained to evaluate the vascular anatomy. CONTRAST:  27mL OMNIPAQUE IOHEXOL 350 MG/ML SOLN COMPARISON:  No priors. FINDINGS: Cardiovascular: No filling defects within the pulmonary arterial tree to suggest the presence of pulmonary embolism. Heart size is normal. There is no significant pericardial fluid, thickening or pericardial calcification. Aortic atherosclerosis. No definite coronary artery calcifications. Mediastinum/Nodes: No pathologically enlarged mediastinal or hilar lymph nodes. Esophagus is unremarkable in appearance. No axillary lymphadenopathy. Lungs/Pleura: 11 x 5 mm (mean diameter of 8 mm) nodular area of architectural distortion in the lateral segment of the right middle lobe (axial image 63 of series 7), favored to represent an area of chronic post infectious or inflammatory scarring, but nonspecific (particularly given the lack of prior examinations). Small calcified granuloma in the medial aspect of the left upper lobe. No other suspicious appearing pulmonary nodules or masses are noted. No acute consolidative airspace disease. No pleural effusions. Upper Abdomen: Aortic atherosclerosis. Musculoskeletal: There are no aggressive appearing lytic or blastic lesions noted in the visualized portions of the skeleton. Review of the MIP images confirms the above findings. IMPRESSION: 1. No evidence of pulmonary embolism. 2. No acute findings  in the thorax to account for the patient's symptoms. 3. Nodular area of architectural distortion in the lateral segment of the right middle lobe, with a mean diameter of 8 mm, favored to represent an area of chronic post infectious or inflammatory scarring, but technically nonspecific. Non-contrast chest CT at 6-12 months is recommended. If the nodule is stable at time of repeat CT,  then future CT at 18-24 months (from today's scan) is considered optional for low-risk patients, but is recommended for high-risk patients. This recommendation follows the consensus statement: Guidelines for Management of Incidental Pulmonary Nodules Detected on CT Images: From the Fleischner Society 2017; Radiology 2017; 284:228-243. 4. Aortic atherosclerosis. Aortic Atherosclerosis (ICD10-I70.0). Electronically Signed   By: Vinnie Langton M.D.   On: 06/01/2021 06:12   MR BRAIN WO CONTRAST  Result Date: 06/26/2021 CLINICAL DATA:  Chest pain and headache that started last night EXAM: MRI HEAD WITHOUT CONTRAST TECHNIQUE: Multiplanar, multiecho pulse sequences of the brain and surrounding structures were obtained without intravenous contrast. COMPARISON:  CTA from earlier the same day FINDINGS: Brain: No acute infarction, hemorrhage, hydrocephalus, extra-axial collection or mass lesion. Vascular: Normal flow voids. Skull and upper cervical spine: Normal marrow signal. Sinuses/Orbits: Negative. IMPRESSION: Normal brain MRI Electronically Signed   By: Jorje Guild M.D.   On: 06/26/2021 05:48   ECHOCARDIOGRAM COMPLETE  Result Date: 06/26/2021    ECHOCARDIOGRAM REPORT   Patient Name:   LELAH RENNAKER Date of Exam: 06/26/2021 Medical Rec #:  [id number]     Height:       60.0 in Accession #:    [id number]    Weight:       185.0 lb Date of Birth:  Mar 04, 1946     BSA:          1.806 m Patient Age:    89 years      BP:           120/77 mmHg Patient Gender: F             HR:           72 bpm. Exam Location:  [facility] Procedure: 2D Echo, Cardiac Doppler and Color Doppler Indications:     Stroke I63.9  History:         Patient has no prior history of Echocardiogram examinations.                  High cholesterol.  Sonographer:     Sherrie Sport Referring Phys:  [id number] Athena Masse Diagnosing Phys: Nelva Bush MD  Sonographer Comments: Technically challenging study due to limited acoustic windows and suboptimal apical  window. IMPRESSIONS  1. Left ventricular ejection fraction, by estimation, is >55%. The left ventricle has normal function. Left ventricular endocardial border not optimally defined to evaluate regional wall motion. There is moderate left ventricular hypertrophy. Left ventricular diastolic parameters are consistent with Grade II diastolic dysfunction (pseudonormalization). Elevated left atrial pressure.  2. Right ventricular systolic function is normal. The right ventricular size is normal. Tricuspid regurgitation signal is inadequate for assessing PA pressure.  3. Left atrial size was mildly dilated.  4. The mitral valve is grossly normal. Trivial mitral valve regurgitation. No evidence of mitral stenosis.  5. The aortic valve is tricuspid. Aortic valve regurgitation is not visualized. No aortic stenosis is present.  6. The inferior vena cava is normal in size with greater than 50% respiratory variability, suggesting right atrial pressure of 3 mmHg. FINDINGS  Left Ventricle: Left ventricular ejection fraction,  by estimation, is >55%. The left ventricle has normal function. Left ventricular endocardial border not optimally defined to evaluate regional wall motion. The left ventricular internal cavity size was  normal in size. There is moderate left ventricular hypertrophy. Left ventricular diastolic parameters are consistent with Grade II diastolic dysfunction (pseudonormalization). Elevated left atrial pressure. Right Ventricle: The right ventricular size is normal. No increase in right ventricular wall thickness. Right ventricular systolic function is normal. Tricuspid regurgitation signal is inadequate for assessing PA pressure. Left Atrium: Left atrial size was mildly dilated. Right Atrium: Right atrial size was normal in size. Pericardium: The pericardium was not well visualized. Mitral Valve: The mitral valve is grossly normal. Trivial mitral valve regurgitation. No evidence of mitral valve stenosis. MV peak  gradient, 3.2 mmHg. The mean mitral valve gradient is 1.0 mmHg. Tricuspid Valve: The tricuspid valve is normal in structure. Tricuspid valve regurgitation is trivial. Aortic Valve: The aortic valve is tricuspid. Aortic valve regurgitation is not visualized. No aortic stenosis is present. Aortic valve mean gradient measures 2.0 mmHg. Aortic valve peak gradient measures 5.7 mmHg. Aortic valve area, by VTI measures 5.39 cm. Pulmonic Valve: The pulmonic valve was not well visualized. Pulmonic valve regurgitation is not visualized. No evidence of pulmonic stenosis. Aorta: The aortic root is normal in size and structure. Pulmonary Artery: The pulmonary artery is not well seen. Venous: The inferior vena cava is normal in size with greater than 50% respiratory variability, suggesting right atrial pressure of 3 mmHg. IAS/Shunts: The interatrial septum was not well visualized.  LEFT VENTRICLE PLAX 2D LVIDd:         4.40 cm   Diastology LVIDs:         3.20 cm   LV e' medial:    3.48 cm/s LV PW:         1.20 cm   LV E/e' medial:  21.8 LV IVS:        1.00 cm   LV e' lateral:   9.03 cm/s LVOT diam:     2.60 cm   LV E/e' lateral: 8.4 LV SV:         106 LV SV Index:   59 LVOT Area:     5.31 cm  RIGHT VENTRICLE RV Basal diam:  3.50 cm RV S prime:     16.50 cm/s LEFT ATRIUM           Index        RIGHT ATRIUM           Index LA diam:      3.40 cm 1.88 cm/m   RA Area:     15.50 cm LA Vol (A4C): 72.3 ml 40.04 ml/m  RA Volume:   42.40 ml  23.48 ml/m  AORTIC VALVE                    PULMONIC VALVE AV Area (Vmax):    4.28 cm     PV Vmax:        0.79 m/s AV Area (Vmean):   5.12 cm     PV Vmean:       58.200 cm/s AV Area (VTI):     5.39 cm     PV VTI:         0.170 m AV Vmax:           119.00 cm/s  PV Peak grad:   2.5 mmHg AV Vmean:          67.000 cm/s  PV Mean grad:   2.0 mmHg AV VTI:            0.196 m      RVOT Peak grad: 3 mmHg AV Peak Grad:      5.7 mmHg AV Mean Grad:      2.0 mmHg LVOT Vmax:         96.00 cm/s LVOT Vmean:         64.600 cm/s LVOT VTI:          0.199 m LVOT/AV VTI ratio: 1.02  AORTA Ao Root diam: 3.57 cm MITRAL VALVE MV Area (PHT): 3.13 cm    SHUNTS MV Area VTI:   3.50 cm    Systemic VTI:  0.20 m MV Peak grad:  3.2 mmHg    Systemic Diam: 2.60 cm MV Mean grad:  1.0 mmHg    Pulmonic VTI:  0.163 m MV Vmax:       0.89 m/s MV Vmean:      51.8 cm/s MV Decel Time: 242 msec MV E velocity: 75.80 cm/s MV A velocity: 69.80 cm/s MV E/A ratio:  1.09 Harrell Gave End MD Electronically signed by Nelva Bush MD Signature Date/Time: 06/26/2021/12:37:25 PM    Final    CT VENOGRAM HEAD  Result Date: 06/01/2021 CLINICAL DATA:  75 year old female with headache since 2000 hours. EXAM: CT VENOGRAM HEAD TECHNIQUE: Venographic phase images of the brain were obtained following the administration of intravenous contrast. Multiplanar reformats and maximum intensity projections were generated. CONTRAST:  35mL OMNIPAQUE IOHEXOL 350 MG/ML SOLN COMPARISON:  Noncontrast head CT 0327 hours today. FINDINGS: Stable CT appearance of the brain from earlier today. No abnormal intracranial enhancement identified. The major dural venous sinuses are enhancing and appear to be patent including the superior sagittal sinus, torcula, straight sinus, vein of Galen, internal cerebral veins, transverse and sigmoid sinuses. The right transverse and sigmoid sinuses are mildly dominant. Both IJ bulbs are enhancing and appear to be patent. Calcified atherosclerosis at the skull base. The major intracranial arterial structures also appear to be patent and enhancing, with dominant appearing right vertebral artery. IMPRESSION: 1. Negative for dural venous sinus thrombosis. 2. Stable, negative CT appearance of the brain since 0327 hours today Electronically Signed   By: Genevie Ann M.D.   On: 06/01/2021 06:24   CT HEAD CODE STROKE WO CONTRAST  Result Date: 06/25/2021 CLINICAL DATA:  Code stroke. Initial evaluation for acute stroke, aphasia. EXAM: CT HEAD WITHOUT  CONTRAST TECHNIQUE: Contiguous axial images were obtained from the base of the skull through the vertex without intravenous contrast. COMPARISON:  CT from 06/01/2021. FINDINGS: Brain: Cerebral volume within normal limits for patient age. No evidence for acute intracranial hemorrhage. No findings to suggest acute large vessel territory infarct. No mass lesion, midline shift, or mass effect. Ventricles are normal in size without evidence for hydrocephalus. No extra-axial fluid collection identified. Vascular: No hyperdense vessel identified. Skull: Scalp soft tissues demonstrate no acute abnormality. Calvarium intact. Sinuses/Orbits: Globes and orbital soft tissues within normal limits. Scattered mucosal thickening noted within the ethmoidal air cells. Paranasal sinuses are otherwise clear. No mastoid effusion. ASPECTS [facility] Stroke Program Early CT Score) - Ganglionic level infarction (caudate, lentiform nuclei, internal capsule, insula, M1-M3 cortex): 7 - Supraganglionic infarction (M4-M6 cortex): 3 Total score (0-10 with 10 being normal): 10 IMPRESSION: 1. Negative head CT. No acute intracranial infarct or other abnormality identified. 2. ASPECTS is 10. Critical Value/emergent results were called by telephone at the time of interpretation on 06/25/2021 at  11:26 pm to provider Dr. Karma Greaser, Who verbally acknowledged these results. Electronically Signed   By: Jeannine Boga M.D.   On: 06/25/2021 23:28    Subjective: Patient was seen and examined today.  Denies any new complaints.  Weakness and slurred speech has been resolved.  Appears to be at baseline.  Son at bedside.  Discharge Exam: Vitals:   06/27/21 1000 06/27/21 1200  BP: 129/64 108/67  Pulse: 65 62  Resp:  18  Temp:    SpO2: 95% 95%   Vitals:   06/27/21 0600 06/27/21 0922 06/27/21 1000 06/27/21 1200  BP: 107/68 134/71 129/64 108/67  Pulse: 65 78 65 62  Resp: 18 17  18   Temp:      TempSrc:      SpO2: 94% 96% 95% 95%  Weight:       Height:        General: Pt is alert, awake, not in acute distress Cardiovascular: RRR, S1/S2 +, no rubs, no gallops Respiratory: CTA bilaterally, no wheezing, no rhonchi Abdominal: Soft, NT, ND, bowel sounds + Extremities: no edema, no cyanosis   The results of significant diagnostics from this hospitalization (including imaging, microbiology, ancillary and laboratory) are listed below for reference.    Microbiology: Recent Results (from the past 240 hour(s))  Resp Panel by RT-PCR (Flu A&B, Covid) Nasopharyngeal Swab     Status: None   Collection Time: 06/25/21 11:30 PM   Specimen: Nasopharyngeal Swab; Nasopharyngeal(NP) swabs in vial transport medium  Result Value Ref Range Status   SARS Coronavirus 2 by RT PCR NEGATIVE NEGATIVE Final    Comment: (NOTE) SARS-CoV-2 target nucleic acids are NOT DETECTED.  The SARS-CoV-2 RNA is generally detectable in upper respiratory specimens during the acute phase of infection. The lowest concentration of SARS-CoV-2 viral copies this assay can detect is 138 copies/mL. A negative result does not preclude SARS-Cov-2 infection and should not be used as the sole basis for treatment or other patient management decisions. A negative result may occur with  improper specimen collection/handling, submission of specimen other than nasopharyngeal swab, presence of viral mutation(s) within the areas targeted by this assay, and inadequate number of viral copies(<138 copies/mL). A negative result must be combined with clinical observations, patient history, and epidemiological information. The expected result is Negative.  Fact Sheet for Patients:  EntrepreneurPulse.com.au  Fact Sheet for Healthcare Providers:  IncredibleEmployment.be  This test is no t yet approved or cleared by the [place] FDA and  has been authorized for detection and/or diagnosis of SARS-CoV-2 by FDA under an Emergency Use Authorization  (EUA). This EUA will remain  in effect (meaning this test can be used) for the duration of the COVID-19 declaration under Section 564(b)(1) of the Act, 21 U.S.C.section 360bbb-3(b)(1), unless the authorization is terminated  or revoked sooner.       Influenza A by PCR NEGATIVE NEGATIVE Final   Influenza B by PCR NEGATIVE NEGATIVE Final    Comment: (NOTE) The Xpert Xpress SARS-CoV-2/FLU/RSV plus assay is intended as an aid in the diagnosis of influenza from Nasopharyngeal swab specimens and should not be used as a sole basis for treatment. Nasal washings and aspirates are unacceptable for Xpert Xpress SARS-CoV-2/FLU/RSV testing.  Fact Sheet for Patients: EntrepreneurPulse.com.au  Fact Sheet for Healthcare Providers: IncredibleEmployment.be  This test is not yet approved or cleared by the [place] FDA and has been authorized for detection and/or diagnosis of SARS-CoV-2 by FDA under an Emergency Use Authorization (EUA). This EUA will remain in  effect (meaning this test can be used) for the duration of the COVID-19 declaration under Section 564(b)(1) of the Act, 21 U.S.C. section 360bbb-3(b)(1), unless the authorization is terminated or revoked.  Performed at [facility], [address] [place], [place] [address]      Labs: BNP (last 3 results) No results for input(s): BNP in the last 8760 hours. Basic Metabolic Panel: Recent Labs  Lab 06/25/21 2300 06/26/21 0539 06/26/21 1153  NA 139  --  135  K 3.3*  --  3.3*  CL 102  --  103  CO2 29  --  26  GLUCOSE 108*  --  183*  BUN 16  --  15  CREATININE 0.96 1.08* 0.86  CALCIUM 9.2  --  8.7*  MG  --   --  1.9  PHOS  --   --  3.1   Liver Function Tests: Recent Labs  Lab 06/25/21 2300  AST 32  ALT 21  ALKPHOS 89  BILITOT 1.4*  PROT 7.1  ALBUMIN 3.4*   Recent Labs  Lab 06/25/21 2300  LIPASE 33   No results for input(s): AMMONIA in the last 168  hours. CBC: Recent Labs  Lab 06/25/21 2300 06/26/21 0539 06/26/21 1153  WBC 6.9 7.2 5.2  NEUTROABS 4.2  --   --   HGB 14.0 14.3 12.5  HCT 41.2 41.6 37.2  MCV 81.1 81.7 81.9  PLT 204 192 178   Cardiac Enzymes: No results for input(s): CKTOTAL, CKMB, CKMBINDEX, TROPONINI in the last 168 hours. BNP: Invalid input(s): POCBNP CBG: Recent Labs  Lab 06/25/21 2320  GLUCAP 101*   D-Dimer No results for input(s): DDIMER in the last 72 hours. Hgb A1c Recent Labs    06/26/21 0539  HGBA1C 5.8*   Lipid Profile Recent Labs    06/26/21 0539  CHOL 158  HDL 22*  LDLCALC 111*  TRIG 124  CHOLHDL 7.2   Thyroid function studies No results for input(s): TSH, T4TOTAL, T3FREE, THYROIDAB in the last 72 hours.  Invalid input(s): FREET3 Anemia work up Recent Labs    06/26/21 1153  VITAMINB12 290  FOLATE 12.4  TIBC 217*  IRON 40   Urinalysis    Component Value Date/Time   COLORURINE YELLOW (A) 06/26/2021 0539   APPEARANCEUR HAZY (A) 06/26/2021 0539   LABSPEC 1.030 06/26/2021 0539   PHURINE 5.0 06/26/2021 0539   GLUCOSEU NEGATIVE 06/26/2021 0539   HGBUR SMALL (A) 06/26/2021 0539   BILIRUBINUR NEGATIVE 06/26/2021 0539   KETONESUR NEGATIVE 06/26/2021 0539   PROTEINUR NEGATIVE 06/26/2021 0539   NITRITE NEGATIVE 06/26/2021 0539   LEUKOCYTESUR LARGE (A) 06/26/2021 0539   Sepsis Labs Invalid input(s): PROCALCITONIN,  WBC,  LACTICIDVEN Microbiology Recent Results (from the past 240 hour(s))  Resp Panel by RT-PCR (Flu A&B, Covid) Nasopharyngeal Swab     Status: None   Collection Time: 06/25/21 11:30 PM   Specimen: Nasopharyngeal Swab; Nasopharyngeal(NP) swabs in vial transport medium  Result Value Ref Range Status   SARS Coronavirus 2 by RT PCR NEGATIVE NEGATIVE Final    Comment: (NOTE) SARS-CoV-2 target nucleic acids are NOT DETECTED.  The SARS-CoV-2 RNA is generally detectable in upper respiratory specimens during the acute phase of infection. The lowest concentration  of SARS-CoV-2 viral copies this assay can detect is 138 copies/mL. A negative result does not preclude SARS-Cov-2 infection and should not be used as the sole basis for treatment or other patient management decisions. A negative result may occur with  improper specimen collection/handling,  submission of specimen other than nasopharyngeal swab, presence of viral mutation(s) within the areas targeted by this assay, and inadequate number of viral copies(<138 copies/mL). A negative result must be combined with clinical observations, patient history, and epidemiological information. The expected result is Negative.  Fact Sheet for Patients:  EntrepreneurPulse.com.au  Fact Sheet for Healthcare Providers:  IncredibleEmployment.be  This test is no t yet approved or cleared by the [place] FDA and  has been authorized for detection and/or diagnosis of SARS-CoV-2 by FDA under an Emergency Use Authorization (EUA). This EUA will remain  in effect (meaning this test can be used) for the duration of the COVID-19 declaration under Section 564(b)(1) of the Act, 21 U.S.C.section 360bbb-3(b)(1), unless the authorization is terminated  or revoked sooner.       Influenza A by PCR NEGATIVE NEGATIVE Final   Influenza B by PCR NEGATIVE NEGATIVE Final    Comment: (NOTE) The Xpert Xpress SARS-CoV-2/FLU/RSV plus assay is intended as an aid in the diagnosis of influenza from Nasopharyngeal swab specimens and should not be used as a sole basis for treatment. Nasal washings and aspirates are unacceptable for Xpert Xpress SARS-CoV-2/FLU/RSV testing.  Fact Sheet for Patients: EntrepreneurPulse.com.au  Fact Sheet for Healthcare Providers: IncredibleEmployment.be  This test is not yet approved or cleared by the [place] FDA and has been authorized for detection and/or diagnosis of SARS-CoV-2 by FDA under an Emergency Use  Authorization (EUA). This EUA will remain in effect (meaning this test can be used) for the duration of the COVID-19 declaration under Section 564(b)(1) of the Act, 21 U.S.C. section 360bbb-3(b)(1), unless the authorization is terminated or revoked.  Performed at [facility], [address] [place], [place] [address]     Time coordinating discharge: Over 30 minutes  SIGNED:  Lorella Nimrod, MD  [facility] 06/27/2021, 1:34 PM  If 7PM-7AM, please contact night-coverage [email or web address]  This record has been created using Systems analyst. Errors have been sought and corrected,but may not always be located. Such creation errors do not reflect on the standard of care.

## 2021-06-27 NOTE — Care Management Obs Status (Signed)
[place] NOTIFICATION   Patient Details  Name: Veronica Wright MRN: [id number] Date of Birth: 1946/04/27   Medicare Observation Status Notification Given:  Yes    Adelene Amas, [place] 06/27/2021, 8:27 AM

## 2021-07-24 ENCOUNTER — Other Ambulatory Visit: Payer: Self-pay | Admitting: Physician Assistant

## 2021-07-24 DIAGNOSIS — Z1231 Encounter for screening mammogram for malignant neoplasm of breast: Secondary | ICD-10-CM

## 2021-07-24 DIAGNOSIS — Z13820 Encounter for screening for osteoporosis: Secondary | ICD-10-CM

## 2022-03-01 ENCOUNTER — Inpatient Hospital Stay: Payer: Medicare PPO

## 2022-03-01 ENCOUNTER — Other Ambulatory Visit: Payer: Self-pay

## 2022-03-01 ENCOUNTER — Inpatient Hospital Stay: Payer: Medicare PPO | Attending: Oncology | Admitting: Oncology

## 2022-03-01 ENCOUNTER — Encounter: Payer: Self-pay | Admitting: Oncology

## 2022-03-01 DIAGNOSIS — D61818 Other pancytopenia: Secondary | ICD-10-CM | POA: Insufficient documentation

## 2022-03-01 DIAGNOSIS — D696 Thrombocytopenia, unspecified: Secondary | ICD-10-CM | POA: Insufficient documentation

## 2022-03-01 DIAGNOSIS — D649 Anemia, unspecified: Secondary | ICD-10-CM | POA: Insufficient documentation

## 2022-03-01 DIAGNOSIS — D72819 Decreased white blood cell count, unspecified: Secondary | ICD-10-CM | POA: Insufficient documentation

## 2022-03-01 LAB — IRON AND TIBC
Iron: 89 ug/dL (ref 28–170)
Saturation Ratios: 27 % (ref 10.4–31.8)
TIBC: 332 ug/dL (ref 250–450)
UIBC: 243 ug/dL

## 2022-03-01 LAB — CBC
HCT: 35.2 % — ABNORMAL LOW (ref 36.0–46.0)
Hemoglobin: 11.3 g/dL — ABNORMAL LOW (ref 12.0–15.0)
MCH: 27.7 pg (ref 26.0–34.0)
MCHC: 32.1 g/dL (ref 30.0–36.0)
MCV: 86.3 fL (ref 80.0–100.0)
Platelets: 107 10*3/uL — ABNORMAL LOW (ref 150–400)
RBC: 4.08 MIL/uL (ref 3.87–5.11)
RDW: 13.2 % (ref 11.5–15.5)
WBC: 3.4 10*3/uL — ABNORMAL LOW (ref 4.0–10.5)
nRBC: 0 % (ref 0.0–0.2)

## 2022-03-01 LAB — LACTATE DEHYDROGENASE: LDH: 187 U/L (ref 98–192)

## 2022-03-01 LAB — FOLATE: Folate: 10.8 ng/mL (ref >5.9)

## 2022-03-01 LAB — FERRITIN: Ferritin: 23 ng/mL (ref 11–307)

## 2022-03-01 LAB — VITAMIN B12: Vitamin B-12: 375 pg/mL (ref 180–914)

## 2022-03-01 NOTE — Progress Notes (Signed)
[place]  Telephone:(336) [phone] Fax:(336) [phone]  ID: Veronica Wright OB: Jan 10, 1946  MR#: [id number]  GLO#:[id number]  Patient Care Team: Mickel Duhamel as PCP - General (Physician Assistant)  CHIEF COMPLAINT: Leukopenia, unspecified.   INTERVAL HISTORY: Patient is a 76 year old female who was noted to have a decreased white blood cell count on routine blood work.  She currently feels well and is asymptomatic.  She has no neurologic complaints.  She denies any recent fevers or illnesses.  She has no new medications.  She has a good appetite and denies weight loss.  She has no chest pain, shortness of breath, cough, or hemoptysis.  She denies any nausea, vomiting, constipation, or diarrhea.  She has no urinary complaints.  Patient feels at her baseline offers no specific complaints today.  REVIEW OF SYSTEMS:   Review of Systems  Constitutional: Negative.  Negative for fever, malaise/fatigue and weight loss.  Respiratory: Negative.  Negative for cough, hemoptysis and shortness of breath.   Cardiovascular: Negative.  Negative for chest pain and leg swelling.  Gastrointestinal: Negative.  Negative for abdominal pain.  Genitourinary: Negative.  Negative for dysuria.  Musculoskeletal: Negative.  Negative for back pain.  Skin: Negative.  Negative for rash.  Neurological: Negative.  Negative for dizziness, focal weakness, weakness and headaches.  Psychiatric/Behavioral: Negative.  The patient is not nervous/anxious.     As per HPI. Otherwise, a complete review of systems is negative.  PAST MEDICAL HISTORY: Past Medical History:  Diagnosis Date   Arthritis    Asthma    Depression    High cholesterol     PAST SURGICAL HISTORY: Past Surgical History:  Procedure Laterality Date   CHOLECYSTECTOMY     COLONOSCOPY WITH PROPOFOL N/A 02/12/2017   Procedure: COLONOSCOPY WITH PROPOFOL;  Surgeon: Lollie Sails, MD;  Location[facility] ENDOSCOPY;  Service:  Endoscopy;  Laterality: N/A;   ESOPHAGOGASTRODUODENOSCOPY (EGD) WITH PROPOFOL N/A 02/12/2017   Procedure: ESOPHAGOGASTRODUODENOSCOPY (EGD) WITH PROPOFOL;  Surgeon: Lollie Sails, MD;  Location: [facility] ENDOSCOPY;  Service: Endoscopy;  Laterality: N/A;    FAMILY HISTORY: Family History  Problem Relation Age of Onset   Breast cancer Sister 89    ADVANCED DIRECTIVES (Y/N):  N  HEALTH MAINTENANCE: Social History   Tobacco Use   Smoking status: Never   Smokeless tobacco: Never  Substance Use Topics   Alcohol use: No   Drug use: No     Colonoscopy:  PAP:  Bone density:  Lipid panel:  Allergies  Allergen Reactions   Penicillins Hives and Swelling    Other reaction(s): Other (See Comments)   Prednisone Other (See Comments)    Sensitivity     Current Outpatient Medications  Medication Sig Dispense Refill   escitalopram (LEXAPRO) 20 MG tablet Take 20 mg by mouth daily.     esomeprazole (NEXIUM) 20 MG capsule Take 20 mg by mouth daily at 12 noon.     zolpidem (AMBIEN) 10 MG tablet Take 10 mg by mouth at bedtime.     albuterol (PROVENTIL HFA;VENTOLIN HFA) 108 (90 BASE) MCG/ACT inhaler Inhale 2 puffs into the lungs every 15 (fifteen) minutes as needed for wheezing or shortness of breath. (Patient not taking: Reported on 03/01/2022) 1 Inhaler 0   aspirin EC 81 MG EC tablet Take 1 tablet (81 mg total) by mouth daily. Swallow whole. (Patient not taking: Reported on 03/01/2022) 30 tablet 11   atorvastatin (LIPITOR) 40 MG tablet Take 1 tablet (40 mg total) by mouth daily. (  Patient not taking: Reported on 03/01/2022) 90 tablet 0   budesonide-formoterol (SYMBICORT) 160-4.5 MCG/ACT inhaler Inhale 2 puffs into the lungs as needed. (Patient not taking: Reported on 03/01/2022)     citalopram (CELEXA) 20 MG tablet Take 20 mg by mouth daily. (Patient not taking: Reported on 03/01/2022)     clopidogrel (PLAVIX) 75 MG tablet Take 1 tablet (75 mg total) by mouth daily. (Patient not taking: Reported on  03/01/2022) 21 tablet 0   lisinopril (ZESTRIL) 10 MG tablet Take 10 mg by mouth daily. (Patient not taking: Reported on 03/01/2022)     triamcinolone (NASACORT) 55 MCG/ACT AERO nasal inhaler Place 2 sprays into the nose as needed. (Patient not taking: Reported on 03/01/2022)     No current facility-administered medications for this visit.    OBJECTIVE: Vitals:   03/01/22 1457  BP: (!) 144/66  Pulse: 61  Resp: 18  Temp: 97.7 F (36.5 C)  SpO2: 96%     Body mass index is 33.2 kg/m.    ECOG FS:0 - Asymptomatic  General: Well-developed, well-nourished, no acute distress. Eyes: Pink conjunctiva, anicteric sclera. HEENT: Normocephalic, moist mucous membranes. Lungs: No audible wheezing or coughing. Heart: Regular rate and rhythm. Abdomen: Soft, nontender, no obvious distention. Musculoskeletal: No edema, cyanosis, or clubbing. Neuro: Alert, answering all questions appropriately. Cranial nerves grossly intact. Skin: No rashes or petechiae noted. Psych: Normal affect. Lymphatics: No cervical, calvicular, axillary or inguinal LAD.   LAB RESULTS:  Lab Results  Component Value Date   NA 135 06/26/2021   K 3.3 (L) 06/26/2021   CL 103 06/26/2021   CO2 26 06/26/2021   GLUCOSE 183 (H) 06/26/2021   BUN 15 06/26/2021   CREATININE 0.86 06/26/2021   CALCIUM 8.7 (L) 06/26/2021   PROT 7.1 06/25/2021   ALBUMIN 3.4 (L) 06/25/2021   AST 32 06/25/2021   ALT 21 06/25/2021   ALKPHOS 89 06/25/2021   BILITOT 1.4 (H) 06/25/2021   GFRNONAA >60 06/26/2021    Lab Results  Component Value Date   WBC 3.4 (L) 03/01/2022   NEUTROABS 4.2 06/25/2021   HGB 11.3 (L) 03/01/2022   HCT 35.2 (L) 03/01/2022   MCV 86.3 03/01/2022   PLT 107 (L) 03/01/2022     STUDIES: No results found.  ASSESSMENT: Leukopenia, unspecified.   PLAN:    Leukopenia, unspecified: Patient's white blood cell count remains decreased at 3.4.  All of her other laboratory work including peripheral blood flow cytometry,  IntelliGEN myeloid panel, and neutrophil antibodies are pending at time of dictation.  No intervention is needed at this time.  Patient does not require bone marrow biopsy.  Patient will have video-assisted telemedicine visit in 4 weeks for further evaluation and discussion of her results. Anemia: Patient also noted to have a mild anemia with hemoglobin 11.3.  Iron stores, folate, and LDH are all within normal limits.  B12 levels are pending at time of dictation. Thrombocytopenia: Mild.  Patient's platelet count is 107.  Laboratory work as above.  Platelet antibodies are pending at time of dictation.  I spent a total of 45 minutes reviewing chart data, face-to-face evaluation with the patient, counseling and coordination of care as detailed above.  Patient expressed understanding and was in agreement with this plan. She also understands that She can call clinic at any time with any questions, concerns, or complaints.    Lloyd Huger, MD   03/01/2022 7:24 PM

## 2022-03-02 LAB — HAPTOGLOBIN: Haptoglobin: <10 mg/dL — ABNORMAL LOW (ref 42–346)

## 2022-03-03 LAB — PLATELET ANTIBODY PROFILE
Glycoprotein IV Antibody: NEGATIVE
HLA Ab Ser Ql EIA: NEGATIVE
IA/IIA Antibody: NEGATIVE
IB/IX Antibody: NEGATIVE
IIB/IIIA Antibody: NEGATIVE

## 2022-03-05 LAB — COMP PANEL: LEUKEMIA/LYMPHOMA

## 2022-03-05 LAB — PROTEIN ELECTROPHORESIS, SERUM
A/G Ratio: 1 (ref 0.7–1.7)
Albumin ELP: 3 g/dL (ref 2.9–4.4)
Alpha-1-Globulin: 0.2 g/dL (ref 0.0–0.4)
Alpha-2-Globulin: 0.5 g/dL (ref 0.4–1.0)
Beta Globulin: 0.9 g/dL (ref 0.7–1.3)
Gamma Globulin: 1.4 g/dL (ref 0.4–1.8)
Globulin, Total: 3 g/dL (ref 2.2–3.9)
Total Protein ELP: 6 g/dL (ref 6.0–8.5)

## 2022-03-06 LAB — NEUTROPHIL AB TEST LEVEL 1: NEUTROPHIL SCR/PANEL INTERP.: NEGATIVE

## 2022-03-12 LAB — INTELLIGEN MYELOID

## 2022-03-24 NOTE — Progress Notes (Deleted)
[facility]  Telephone:(336) [phone] Fax:(336) [phone]  ID: Veronica Wright OB: [birth year]  MR#: [id number]  BWG#:[id number]  Patient Care Team: Mickel Duhamel as PCP - General (Physician Assistant)  I connected with Veronica Wright on 03/24/22 at  2:45 PM EDT by {Blank single:19197::"video enabled telemedicine visit","telephone visit"} and verified that I am speaking with the correct person using two identifiers.   I discussed the limitations, risks, security and privacy concerns of performing an evaluation and management service by telemedicine and the availability of in-person appointments. I also discussed with the patient that there may be a patient responsible charge related to this service. The patient expressed understanding and agreed to proceed.   Other persons participating in the visit and their role in the encounter: Patient, MD.  Patient's location: Home. Provider's location: Clinic.  CHIEF COMPLAINT: Leukopenia, unspecified.   INTERVAL HISTORY: Patient is a 76 year old female who was noted to have a decreased white blood cell count on routine blood work.  She currently feels well and is asymptomatic.  She has no neurologic complaints.  She denies any recent fevers or illnesses.  She has no new medications.  She has a good appetite and denies weight loss.  She has no chest pain, shortness of breath, cough, or hemoptysis.  She denies any nausea, vomiting, constipation, or diarrhea.  She has no urinary complaints.  Patient feels at her baseline offers no specific complaints today.  REVIEW OF SYSTEMS:   Review of Systems  Constitutional: Negative.  Negative for fever, malaise/fatigue and weight loss.  Respiratory: Negative.  Negative for cough, hemoptysis and shortness of breath.   Cardiovascular: Negative.  Negative for chest pain and leg swelling.  Gastrointestinal: Negative.  Negative for abdominal pain.  Genitourinary: Negative.  Negative for  dysuria.  Musculoskeletal: Negative.  Negative for back pain.  Skin: Negative.  Negative for rash.  Neurological: Negative.  Negative for dizziness, focal weakness, weakness and headaches.  Psychiatric/Behavioral: Negative.  The patient is not nervous/anxious.     As per HPI. Otherwise, a complete review of systems is negative.  PAST MEDICAL HISTORY: Past Medical History:  Diagnosis Date   Arthritis    Asthma    Depression    High cholesterol     PAST SURGICAL HISTORY: Past Surgical History:  Procedure Laterality Date   CHOLECYSTECTOMY     COLONOSCOPY WITH PROPOFOL N/A 02/12/2017   Procedure: COLONOSCOPY WITH PROPOFOL;  Surgeon: Lollie Sails, MD;  Location[facility] ENDOSCOPY;  Service: Endoscopy;  Laterality: N/A;   ESOPHAGOGASTRODUODENOSCOPY (EGD) WITH PROPOFOL N/A 02/12/2017   Procedure: ESOPHAGOGASTRODUODENOSCOPY (EGD) WITH PROPOFOL;  Surgeon: Lollie Sails, MD;  Location: [facility] ENDOSCOPY;  Service: Endoscopy;  Laterality: N/A;    FAMILY HISTORY: Family History  Problem Relation Age of Onset   Breast cancer Sister 45    ADVANCED DIRECTIVES (Y/N):  N  HEALTH MAINTENANCE: Social History   Tobacco Use   Smoking status: Never   Smokeless tobacco: Never  Substance Use Topics   Alcohol use: No   Drug use: No     Colonoscopy:  PAP:  Bone density:  Lipid panel:  Allergies  Allergen Reactions   Penicillins Hives and Swelling    Other reaction(s): Other (See Comments)   Prednisone Other (See Comments)    Sensitivity     Current Outpatient Medications  Medication Sig Dispense Refill   albuterol (PROVENTIL HFA;VENTOLIN HFA) 108 (90 BASE) MCG/ACT inhaler Inhale 2 puffs into the lungs every 15 (fifteen) minutes as needed  for wheezing or shortness of breath. (Patient not taking: Reported on 03/01/2022) 1 Inhaler 0   aspirin EC 81 MG EC tablet Take 1 tablet (81 mg total) by mouth daily. Swallow whole. (Patient not taking: Reported on 03/01/2022) 30 tablet 11    atorvastatin (LIPITOR) 40 MG tablet Take 1 tablet (40 mg total) by mouth daily. (Patient not taking: Reported on 03/01/2022) 90 tablet 0   budesonide-formoterol (SYMBICORT) 160-4.5 MCG/ACT inhaler Inhale 2 puffs into the lungs as needed. (Patient not taking: Reported on 03/01/2022)     citalopram (CELEXA) 20 MG tablet Take 20 mg by mouth daily. (Patient not taking: Reported on 03/01/2022)     clopidogrel (PLAVIX) 75 MG tablet Take 1 tablet (75 mg total) by mouth daily. (Patient not taking: Reported on 03/01/2022) 21 tablet 0   escitalopram (LEXAPRO) 20 MG tablet Take 20 mg by mouth daily.     esomeprazole (NEXIUM) 20 MG capsule Take 20 mg by mouth daily at 12 noon.     lisinopril (ZESTRIL) 10 MG tablet Take 10 mg by mouth daily. (Patient not taking: Reported on 03/01/2022)     triamcinolone (NASACORT) 55 MCG/ACT AERO nasal inhaler Place 2 sprays into the nose as needed. (Patient not taking: Reported on 03/01/2022)     zolpidem (AMBIEN) 10 MG tablet Take 10 mg by mouth at bedtime.     No current facility-administered medications for this visit.    OBJECTIVE: There were no vitals filed for this visit.    There is no height or weight on file to calculate BMI.    ECOG FS:0 - Asymptomatic  General: Well-developed, well-nourished, no acute distress. Eyes: Pink conjunctiva, anicteric sclera. HEENT: Normocephalic, moist mucous membranes. Lungs: No audible wheezing or coughing. Heart: Regular rate and rhythm. Abdomen: Soft, nontender, no obvious distention. Musculoskeletal: No edema, cyanosis, or clubbing. Neuro: Alert, answering all questions appropriately. Cranial nerves grossly intact. Skin: No rashes or petechiae noted. Psych: Normal affect. Lymphatics: No cervical, calvicular, axillary or inguinal LAD.   LAB RESULTS:  Lab Results  Component Value Date   NA 135 06/26/2021   K 3.3 (L) 06/26/2021   CL 103 06/26/2021   CO2 26 06/26/2021   GLUCOSE 183 (H) 06/26/2021   BUN 15 06/26/2021    CREATININE 0.86 06/26/2021   CALCIUM 8.7 (L) 06/26/2021   PROT 7.1 06/25/2021   ALBUMIN 3.4 (L) 06/25/2021   AST 32 06/25/2021   ALT 21 06/25/2021   ALKPHOS 89 06/25/2021   BILITOT 1.4 (H) 06/25/2021   GFRNONAA >60 06/26/2021    Lab Results  Component Value Date   WBC 3.4 (L) 03/01/2022   NEUTROABS 4.2 06/25/2021   HGB 11.3 (L) 03/01/2022   HCT 35.2 (L) 03/01/2022   MCV 86.3 03/01/2022   PLT 107 (L) 03/01/2022     STUDIES: No results found.  ASSESSMENT: Leukopenia, unspecified.   PLAN:    Leukopenia, unspecified: Patient's white blood cell count remains decreased at 3.4.  All of her other laboratory work including peripheral blood flow cytometry, IntelliGEN myeloid panel, and neutrophil antibodies are pending at time of dictation.  No intervention is needed at this time.  Patient does not require bone marrow biopsy.  Patient will have video-assisted telemedicine visit in 4 weeks for further evaluation and discussion of her results. Anemia: Patient also noted to have a mild anemia with hemoglobin 11.3.  Iron stores, folate, and LDH are all within normal limits.  B12 levels are pending at time of dictation. Thrombocytopenia: Mild.  Patient's  platelet count is 107.  Laboratory work as above.  Platelet antibodies are pending at time of dictation.  I provided *** minutes of {Blank single:19197::"face-to-face video visit time","non face-to-face telephone visit time"} during this encounter which included chart review, counseling, and coordination of care as documented above.   Patient expressed understanding and was in agreement with this plan. She also understands that She can call clinic at any time with any questions, concerns, or complaints.    Lloyd Huger, MD   03/24/2022 8:00 AM

## 2022-03-29 ENCOUNTER — Inpatient Hospital Stay: Payer: Medicare PPO | Admitting: Oncology

## 2022-03-29 DIAGNOSIS — D72819 Decreased white blood cell count, unspecified: Secondary | ICD-10-CM

## 2022-05-29 NOTE — Progress Notes (Unsigned)
[place]  [phone] Fax:(336) [phone]  ID: Veronica Wright OB: [birth year]  MR#: [id number]  TDS#:[id number]  Patient Care Team: Mickel Duhamel as PCP - General (Physician Assistant)  CHIEF COMPLAINT: Leukopenia, unspecified.   INTERVAL HISTORY: Patient returns to clinic today for repeat laboratory work and further evaluation.  She continues to feel well and remains asymptomatic.  She has no neurologic complaints.  She denies any recent fevers or illnesses. She has a good appetite and denies weight loss.  She has no chest pain, shortness of breath, cough, or hemoptysis.  She denies any nausea, vomiting, constipation, or diarrhea.  She has no urinary complaints.  Patient is no specific complaints today.  REVIEW OF SYSTEMS:   Review of Systems  Constitutional:  Positive for malaise/fatigue. Negative for fever and weight loss.  Respiratory: Negative.  Negative for cough, hemoptysis and shortness of breath.   Cardiovascular: Negative.  Negative for chest pain and leg swelling.  Gastrointestinal: Negative.  Negative for abdominal pain.  Genitourinary: Negative.  Negative for dysuria.  Musculoskeletal: Negative.  Negative for back pain.  Skin: Negative.  Negative for rash.  Neurological: Negative.  Negative for dizziness, focal weakness, weakness and headaches.  Psychiatric/Behavioral: Negative.  The patient is not nervous/anxious.     As per HPI. Otherwise, a complete review of systems is negative.  PAST MEDICAL HISTORY: Past Medical History:  Diagnosis Date   Arthritis    Asthma    Depression    High cholesterol     PAST SURGICAL HISTORY: Past Surgical History:  Procedure Laterality Date   CHOLECYSTECTOMY     COLONOSCOPY WITH PROPOFOL N/A 02/12/2017   Procedure: COLONOSCOPY WITH PROPOFOL;  Surgeon: Lollie Sails, MD;  Location: [facility] ENDOSCOPY;  Service: Endoscopy;  Laterality: N/A;   ESOPHAGOGASTRODUODENOSCOPY (EGD) WITH PROPOFOL  N/A 02/12/2017   Procedure: ESOPHAGOGASTRODUODENOSCOPY (EGD) WITH PROPOFOL;  Surgeon: Lollie Sails, MD;  Location: [facility] ENDOSCOPY;  Service: Endoscopy;  Laterality: N/A;    FAMILY HISTORY: Family History  Problem Relation Age of Onset   Breast cancer Sister 43    ADVANCED DIRECTIVES (Y/N):  N  HEALTH MAINTENANCE: Social History   Tobacco Use   Smoking status: Never   Smokeless tobacco: Never  Substance Use Topics   Alcohol use: No   Drug use: No     Colonoscopy:  PAP:  Bone density:  Lipid panel:  Allergies  Allergen Reactions   Penicillins Hives and Swelling    Other reaction(s): Other (See Comments)   Prednisone Other (See Comments)    Sensitivity     Current Outpatient Medications  Medication Sig Dispense Refill   escitalopram (LEXAPRO) 20 MG tablet Take 20 mg by mouth daily.     esomeprazole (NEXIUM) 20 MG capsule Take 20 mg by mouth daily at 12 noon.     zolpidem (AMBIEN) 10 MG tablet Take 10 mg by mouth at bedtime.     albuterol (PROVENTIL HFA;VENTOLIN HFA) 108 (90 BASE) MCG/ACT inhaler Inhale 2 puffs into the lungs every 15 (fifteen) minutes as needed for wheezing or shortness of breath. (Patient not taking: Reported on 03/01/2022) 1 Inhaler 0   aspirin EC 81 MG EC tablet Take 1 tablet (81 mg total) by mouth daily. Swallow whole. (Patient not taking: Reported on 03/01/2022) 30 tablet 11   atorvastatin (LIPITOR) 40 MG tablet Take 1 tablet (40 mg total) by mouth daily. (Patient not taking: Reported on 03/01/2022) 90 tablet 0   budesonide-formoterol (SYMBICORT) 160-4.5 MCG/ACT inhaler Inhale  2 puffs into the lungs as needed. (Patient not taking: Reported on 03/01/2022)     citalopram (CELEXA) 20 MG tablet Take 20 mg by mouth daily. (Patient not taking: Reported on 03/01/2022)     clopidogrel (PLAVIX) 75 MG tablet Take 1 tablet (75 mg total) by mouth daily. (Patient not taking: Reported on 03/01/2022) 21 tablet 0   lisinopril (ZESTRIL) 10 MG tablet Take 10 mg by mouth  daily. (Patient not taking: Reported on 03/01/2022)     triamcinolone (NASACORT) 55 MCG/ACT AERO nasal inhaler Place 2 sprays into the nose as needed. (Patient not taking: Reported on 03/01/2022)     No current facility-administered medications for this visit.    OBJECTIVE: Vitals:   05/31/22 1039  BP: (!) 123/55  Pulse: (!) 50  Temp: 97.6 F (36.4 C)     Body mass index is 31.46 kg/m.    ECOG FS:0 - Asymptomatic  General: Well-developed, well-nourished, no acute distress. Eyes: Pink conjunctiva, anicteric sclera. HEENT: Normocephalic, moist mucous membranes. Lungs: No audible wheezing or coughing. Heart: Regular rate and rhythm. Abdomen: Soft, nontender, no obvious distention. Musculoskeletal: No edema, cyanosis, or clubbing. Neuro: Alert, answering all questions appropriately. Cranial nerves grossly intact. Skin: No rashes or petechiae noted. Psych: Normal affect.   LAB RESULTS:  Lab Results  Component Value Date   NA 135 06/26/2021   K 3.3 (L) 06/26/2021   CL 103 06/26/2021   CO2 26 06/26/2021   GLUCOSE 183 (H) 06/26/2021   BUN 15 06/26/2021   CREATININE 0.86 06/26/2021   CALCIUM 8.7 (L) 06/26/2021   PROT 7.1 06/25/2021   ALBUMIN 3.4 (L) 06/25/2021   AST 32 06/25/2021   ALT 21 06/25/2021   ALKPHOS 89 06/25/2021   BILITOT 1.4 (H) 06/25/2021   GFRNONAA >60 06/26/2021    Lab Results  Component Value Date   WBC 2.7 (L) 05/31/2022   NEUTROABS 1.2 (L) 05/31/2022   HGB 11.4 (L) 05/31/2022   HCT 34.9 (L) 05/31/2022   MCV 84.7 05/31/2022   PLT 89 (L) 05/31/2022     STUDIES: No results found.  ASSESSMENT: Leukopenia, unspecified.   PLAN:    Leukopenia, unspecified: Patient's white blood cell count has trended down slightly to 2.7.  All of her other laboratory work including peripheral blood flow cytometry was either negative or within normal limits. 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  It is possible patient has underlying  MDS, but this will take a bone marrow biopsy to diagnose which is not necessary at this point.  No intervention is needed.  Return to clinic in 1 month for laboratory work only and then in 3 months for laboratory work and further evaluation. Anemia: Chronic and unchanged.  Patient's hemoglobin is 11.4.  Monitor. Thrombocytopenia: Platelet count has trended down to 89.  Monitor.  Follow-up as above.  Patient expressed understanding and was in agreement with this plan. She also understands that She can call clinic at any time with any questions, concerns, or complaints.    Lloyd Huger, MD   05/31/2022 4:29 PM

## 2022-05-31 ENCOUNTER — Inpatient Hospital Stay: Payer: Medicare PPO

## 2022-05-31 ENCOUNTER — Encounter: Payer: Self-pay | Admitting: Oncology

## 2022-05-31 ENCOUNTER — Inpatient Hospital Stay: Payer: Medicare PPO | Attending: Oncology | Admitting: Oncology

## 2022-05-31 ENCOUNTER — Telehealth: Payer: Self-pay

## 2022-05-31 VITALS — BP 123/55 | HR 50 | Temp 97.6°F | Ht 62.0 in | Wt 172.0 lb

## 2022-05-31 DIAGNOSIS — D649 Anemia, unspecified: Secondary | ICD-10-CM | POA: Diagnosis not present

## 2022-05-31 DIAGNOSIS — D72819 Decreased white blood cell count, unspecified: Secondary | ICD-10-CM | POA: Insufficient documentation

## 2022-05-31 DIAGNOSIS — D696 Thrombocytopenia, unspecified: Secondary | ICD-10-CM | POA: Insufficient documentation

## 2022-05-31 LAB — CBC WITH DIFFERENTIAL/PLATELET
Abs Immature Granulocytes: 0 10*3/uL (ref 0.00–0.07)
Basophils Absolute: 0 10*3/uL (ref 0.0–0.1)
Basophils Relative: 1 %
Eosinophils Absolute: 0.1 10*3/uL (ref 0.0–0.5)
Eosinophils Relative: 3 %
HCT: 34.9 % — ABNORMAL LOW (ref 36.0–46.0)
Hemoglobin: 11.4 g/dL — ABNORMAL LOW (ref 12.0–15.0)
Immature Granulocytes: 0 %
Lymphocytes Relative: 40 %
Lymphs Abs: 1.1 10*3/uL (ref 0.7–4.0)
MCH: 27.7 pg (ref 26.0–34.0)
MCHC: 32.7 g/dL (ref 30.0–36.0)
MCV: 84.7 fL (ref 80.0–100.0)
Monocytes Absolute: 0.3 10*3/uL (ref 0.1–1.0)
Monocytes Relative: 12 %
Neutro Abs: 1.2 10*3/uL — ABNORMAL LOW (ref 1.7–7.7)
Neutrophils Relative %: 44 %
Platelets: 89 10*3/uL — ABNORMAL LOW (ref 150–400)
RBC: 4.12 MIL/uL (ref 3.87–5.11)
RDW: 14.2 % (ref 11.5–15.5)
WBC: 2.7 10*3/uL — ABNORMAL LOW (ref 4.0–10.5)
nRBC: 0 % (ref 0.0–0.2)

## 2022-05-31 NOTE — Telephone Encounter (Signed)
Per Finn-I told patient I might add a lab appointment if her numbers dropped, well they did so can we add a cbc only in 1 month?  Thanks!

## 2022-07-02 ENCOUNTER — Inpatient Hospital Stay: Payer: Medicare PPO

## 2022-07-17 ENCOUNTER — Inpatient Hospital Stay: Payer: Medicare PPO | Attending: Oncology

## 2022-09-05 ENCOUNTER — Inpatient Hospital Stay: Payer: Medicare PPO | Attending: Oncology

## 2022-09-05 DIAGNOSIS — D72819 Decreased white blood cell count, unspecified: Secondary | ICD-10-CM

## 2022-09-05 LAB — CBC WITH DIFFERENTIAL/PLATELET
Abs Immature Granulocytes: 0 10*3/uL (ref 0.00–0.07)
Basophils Absolute: 0 10*3/uL (ref 0.0–0.1)
Basophils Relative: 1 %
Eosinophils Absolute: 0.1 10*3/uL (ref 0.0–0.5)
Eosinophils Relative: 4 %
HCT: 33.2 % — ABNORMAL LOW (ref 36.0–46.0)
Hemoglobin: 10.9 g/dL — ABNORMAL LOW (ref 12.0–15.0)
Immature Granulocytes: 0 %
Lymphocytes Relative: 45 %
Lymphs Abs: 1.7 10*3/uL (ref 0.7–4.0)
MCH: 27.7 pg (ref 26.0–34.0)
MCHC: 32.8 g/dL (ref 30.0–36.0)
MCV: 84.5 fL (ref 80.0–100.0)
Monocytes Absolute: 0.4 10*3/uL (ref 0.1–1.0)
Monocytes Relative: 11 %
Neutro Abs: 1.5 10*3/uL — ABNORMAL LOW (ref 1.7–7.7)
Neutrophils Relative %: 39 %
Platelets: 92 10*3/uL — ABNORMAL LOW (ref 150–400)
RBC: 3.93 MIL/uL (ref 3.87–5.11)
RDW: 15.1 % (ref 11.5–15.5)
WBC: 3.8 10*3/uL — ABNORMAL LOW (ref 4.0–10.5)
nRBC: 0 % (ref 0.0–0.2)

## 2022-11-29 ENCOUNTER — Inpatient Hospital Stay: Payer: Medicare PPO

## 2022-11-29 ENCOUNTER — Telehealth: Payer: Self-pay | Admitting: Oncology

## 2022-11-29 ENCOUNTER — Other Ambulatory Visit: Payer: Self-pay | Admitting: *Deleted

## 2022-11-29 ENCOUNTER — Inpatient Hospital Stay: Payer: Medicare PPO | Admitting: Oncology

## 2022-11-29 DIAGNOSIS — D72819 Decreased white blood cell count, unspecified: Secondary | ICD-10-CM

## 2022-11-29 NOTE — Telephone Encounter (Signed)
Veronica Wright sent a message thru answering service that says cancel her appt today 11/29/22 and please call to reschedule. Team was notified.

## 2023-01-08 ENCOUNTER — Inpatient Hospital Stay: Payer: Medicare PPO | Attending: Oncology | Admitting: Oncology

## 2023-01-08 ENCOUNTER — Encounter: Payer: Self-pay | Admitting: Oncology

## 2023-01-08 ENCOUNTER — Inpatient Hospital Stay: Payer: Medicare PPO

## 2023-01-08 VITALS — BP 147/69 | HR 64 | Temp 97.6°F | Resp 20 | Wt 178.7 lb

## 2023-01-08 DIAGNOSIS — D649 Anemia, unspecified: Secondary | ICD-10-CM | POA: Insufficient documentation

## 2023-01-08 DIAGNOSIS — D696 Thrombocytopenia, unspecified: Secondary | ICD-10-CM | POA: Insufficient documentation

## 2023-01-08 DIAGNOSIS — D61818 Other pancytopenia: Secondary | ICD-10-CM | POA: Diagnosis not present

## 2023-01-08 DIAGNOSIS — D72819 Decreased white blood cell count, unspecified: Secondary | ICD-10-CM

## 2023-01-08 LAB — CBC WITH DIFFERENTIAL/PLATELET
Abs Immature Granulocytes: 0.01 10*3/uL (ref 0.00–0.07)
Basophils Absolute: 0 10*3/uL (ref 0.0–0.1)
Basophils Relative: 1 %
Eosinophils Absolute: 0.1 10*3/uL (ref 0.0–0.5)
Eosinophils Relative: 5 %
HCT: 34.4 % — ABNORMAL LOW (ref 36.0–46.0)
Hemoglobin: 10.9 g/dL — ABNORMAL LOW (ref 12.0–15.0)
Immature Granulocytes: 0 %
Lymphocytes Relative: 39 %
Lymphs Abs: 0.9 10*3/uL (ref 0.7–4.0)
MCH: 27 pg (ref 26.0–34.0)
MCHC: 31.7 g/dL (ref 30.0–36.0)
MCV: 85.1 fL (ref 80.0–100.0)
Monocytes Absolute: 0.2 10*3/uL (ref 0.1–1.0)
Monocytes Relative: 9 %
Neutro Abs: 1.1 10*3/uL — ABNORMAL LOW (ref 1.7–7.7)
Neutrophils Relative %: 46 %
Platelets: 83 10*3/uL — ABNORMAL LOW (ref 150–400)
RBC: 4.04 MIL/uL (ref 3.87–5.11)
RDW: 15.1 % (ref 11.5–15.5)
WBC: 2.4 10*3/uL — ABNORMAL LOW (ref 4.0–10.5)
nRBC: 0 % (ref 0.0–0.2)

## 2023-01-08 NOTE — Progress Notes (Signed)
Patient states she has noticed her blood pressure has been elevated and she is more fatigued.

## 2023-01-08 NOTE — Progress Notes (Signed)
[facility]  Telephone:(336) [phone] Fax:(336) [phone]  ID: Veronica Wright OB: 09/01/1945  MR#: [id number]  AOZ#:[id number]  Patient Care Team: Donavan Burnet as PCP - General (Physician Assistant)  CHIEF COMPLAINT: Pancytopenia.   INTERVAL HISTORY: Patient returns to clinic today for repeat laboratory work and further evaluation.  She has chronic fatigue which she attributes to insomnia, but otherwise feels well.  She has no neurologic complaints.  She denies any recent fevers or illnesses. She has a good appetite and denies weight loss.  She has no chest pain, shortness of breath, cough, or hemoptysis.  She denies any nausea, vomiting, constipation, or diarrhea.  She has no urinary complaints.  Patient offers no further specific complaints today.  REVIEW OF SYSTEMS:   Review of Systems  Constitutional:  Positive for malaise/fatigue. Negative for fever and weight loss.  Respiratory: Negative.  Negative for cough, hemoptysis and shortness of breath.   Cardiovascular: Negative.  Negative for chest pain and leg swelling.  Gastrointestinal: Negative.  Negative for abdominal pain.  Genitourinary: Negative.  Negative for dysuria.  Musculoskeletal: Negative.  Negative for back pain.  Skin: Negative.  Negative for rash.  Neurological: Negative.  Negative for dizziness, focal weakness, weakness and headaches.  Psychiatric/Behavioral:  The patient has insomnia. The patient is not nervous/anxious.     As per HPI. Otherwise, a complete review of systems is negative.  PAST MEDICAL HISTORY: Past Medical History:  Diagnosis Date   Arthritis    Asthma    Depression    High cholesterol     PAST SURGICAL HISTORY: Past Surgical History:  Procedure Laterality Date   CHOLECYSTECTOMY     COLONOSCOPY WITH PROPOFOL N/A 02/12/2017   Procedure: COLONOSCOPY WITH PROPOFOL;  Surgeon: Christena Deem, MD;  Location: [facility] ENDOSCOPY;  Service: Endoscopy;  Laterality: N/A;    ESOPHAGOGASTRODUODENOSCOPY (EGD) WITH PROPOFOL N/A 02/12/2017   Procedure: ESOPHAGOGASTRODUODENOSCOPY (EGD) WITH PROPOFOL;  Surgeon: Christena Deem, MD;  Location: [facility] ENDOSCOPY;  Service: Endoscopy;  Laterality: N/A;    FAMILY HISTORY: Family History  Problem Relation Age of Onset   Breast cancer Sister 46    ADVANCED DIRECTIVES (Y/N):  N  HEALTH MAINTENANCE: Social History   Tobacco Use   Smoking status: Never   Smokeless tobacco: Never  Substance Use Topics   Alcohol use: No   Drug use: No     Colonoscopy:  PAP:  Bone density:  Lipid panel:  Allergies  Allergen Reactions   Penicillins Hives and Swelling    Other reaction(s): Other (See Comments)   Prednisone Other (See Comments)    Sensitivity     Current Outpatient Medications  Medication Sig Dispense Refill   escitalopram (LEXAPRO) 20 MG tablet Take 20 mg by mouth daily.     esomeprazole (NEXIUM) 20 MG capsule Take 20 mg by mouth daily at 12 noon.     albuterol (PROVENTIL HFA;VENTOLIN HFA) 108 (90 BASE) MCG/ACT inhaler Inhale 2 puffs into the lungs every 15 (fifteen) minutes as needed for wheezing or shortness of breath. (Patient not taking: Reported on 03/01/2022) 1 Inhaler 0   aspirin EC 81 MG EC tablet Take 1 tablet (81 mg total) by mouth daily. Swallow whole. (Patient not taking: Reported on 03/01/2022) 30 tablet 11   atorvastatin (LIPITOR) 40 MG tablet Take 1 tablet (40 mg total) by mouth daily. (Patient not taking: Reported on 03/01/2022) 90 tablet 0   budesonide-formoterol (SYMBICORT) 160-4.5 MCG/ACT inhaler Inhale 2 puffs into the lungs as needed. (Patient  not taking: Reported on 03/01/2022)     citalopram (CELEXA) 20 MG tablet Take 20 mg by mouth daily. (Patient not taking: Reported on 03/01/2022)     clopidogrel (PLAVIX) 75 MG tablet Take 1 tablet (75 mg total) by mouth daily. (Patient not taking: Reported on 03/01/2022) 21 tablet 0   lisinopril (ZESTRIL) 10 MG tablet Take 10 mg by mouth daily. (Patient not  taking: Reported on 03/01/2022)     triamcinolone (NASACORT) 55 MCG/ACT AERO nasal inhaler Place 2 sprays into the nose as needed. (Patient not taking: Reported on 03/01/2022)     zolpidem (AMBIEN) 10 MG tablet Take 10 mg by mouth at bedtime. (Patient not taking: Reported on 01/08/2023)     No current facility-administered medications for this visit.    OBJECTIVE: Vitals:   01/08/23 1424  BP: (!) 147/69  Pulse: 64  Resp: 20  Temp: 97.6 F (36.4 C)  SpO2: 100%     Body mass index is 32.68 kg/m.    ECOG FS:0 - Asymptomatic  General: Well-developed, well-nourished, no acute distress. Eyes: Pink conjunctiva, anicteric sclera. HEENT: Normocephalic, moist mucous membranes. Lungs: No audible wheezing or coughing. Heart: Regular rate and rhythm. Abdomen: Soft, nontender, no obvious distention. Musculoskeletal: No edema, cyanosis, or clubbing. Neuro: Alert, answering all questions appropriately. Cranial nerves grossly intact. Skin: No rashes or petechiae noted. Psych: Normal affect.  LAB RESULTS:  Lab Results  Component Value Date   NA 135 06/26/2021   K 3.3 (L) 06/26/2021   CL 103 06/26/2021   CO2 26 06/26/2021   GLUCOSE 183 (H) 06/26/2021   BUN 15 06/26/2021   CREATININE 0.86 06/26/2021   CALCIUM 8.7 (L) 06/26/2021   PROT 7.1 06/25/2021   ALBUMIN 3.4 (L) 06/25/2021   AST 32 06/25/2021   ALT 21 06/25/2021   ALKPHOS 89 06/25/2021   BILITOT 1.4 (H) 06/25/2021   GFRNONAA >60 06/26/2021    Lab Results  Component Value Date   WBC 2.4 (L) 01/08/2023   NEUTROABS 1.1 (L) 01/08/2023   HGB 10.9 (L) 01/08/2023   HCT 34.4 (L) 01/08/2023   MCV 85.1 01/08/2023   PLT 83 (L) 01/08/2023     STUDIES: No results found.  ASSESSMENT: Pancytopenia.   PLAN:    Leukopenia, unspecified: Chronic and unchanged.  Patient's white blood cell count is 2.4 today.  All of her other laboratory work including peripheral blood flow cytometry was either negative or within normal limits. 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  It is possible patient has underlying MDS, but this will take a bone marrow biopsy to diagnose which is not necessary at this point.  We discussed the possibility of a biopsy today, but patient wishes to defer this procedure and plans to talk it over further with her son.  She will call clinic if she wishes to pursue a biopsy.  No further intervention is needed.  Return to clinic in 3 months with repeat laboratory work and further evaluation.   Anemia: Chronic and unchanged.  Patient's hemoglobin is 10.9 today. Thrombocytopenia: Chronic and unchanged.  Patient's platelet count is 83 today.  Patient expressed understanding and was in agreement with this plan. She also understands that She can call clinic at any time with any questions, concerns, or complaints.    Jeralyn Ruths, MD   01/08/2023 3:07 PM

## 2023-03-12 ENCOUNTER — Ambulatory Visit: Payer: Medicare PPO | Admitting: Neurology

## 2023-03-12 ENCOUNTER — Encounter: Payer: Self-pay | Admitting: Neurology

## 2023-03-12 VITALS — BP 130/58 | HR 52 | Ht 60.0 in | Wt 186.0 lb

## 2023-03-12 DIAGNOSIS — F5104 Psychophysiologic insomnia: Secondary | ICD-10-CM | POA: Diagnosis not present

## 2023-03-12 DIAGNOSIS — D696 Thrombocytopenia, unspecified: Secondary | ICD-10-CM | POA: Diagnosis not present

## 2023-03-12 DIAGNOSIS — Z9189 Other specified personal risk factors, not elsewhere classified: Secondary | ICD-10-CM

## 2023-03-12 DIAGNOSIS — G4724 Circadian rhythm sleep disorder, free running type: Secondary | ICD-10-CM

## 2023-03-12 NOTE — Progress Notes (Signed)
SLEEP MEDICINE CLINIC    Provider:  Melvyn Novas, MD  Primary Care Physician:  Loreli Slot, PA-C [address] [place] [address]     Referring Provider: Loreli Slot, Pa-c [address]  [place] [address]          Chief Complaint according to patient   Patient presents with:     New Patient (Initial Visit)           HISTORY OF PRESENT ILLNESS:  Veronica Wright is a 77 y.o. female patient who is seen upon referral on 03/12/2023 from [facility] [place], In [place]- she wants her evaluated for an organic sleep disorders.   Chief concern according to patient :  I developed chronic cough after a Covid infection for 2 years, it stopped on its own.  For a couple of years she would wake every night to cough,  having a tickling in her throat, pulmonology has followed ( June 2023)   Then in January she was referred to Dr Sherryll Burger in [place], neurology-  for a sleep evaluation.  She can't remember seeing him, may be it was a tele visit? .     I have the pleasure of seeing Ms. Veronica Wright on 03/12/23 , who is a right-handed female with a possible organic sleep disorder.  She has been treated for chronic Insomnia by hr PCP  with Ambien for years, failed many other medications and  since February 2024 her Ambien stopped working.   The patient never had a sleep study. She is wide awake at night and sleeps in daytime.      Sleep relevant medical history: Nocturia once,  no Sleep walking, lives alone- No Tonsillectomy, GERD , nexium, chronic insomnia a with circadian rhythm changes , for decades.  Family medical /sleep history: No  other family member on CPAP with OSA, sister and nephew with insomnia, no sleep walkers.    Social history:  Patient is retired from  [employer], and lives in a household alone.  2 dogs and one cat.  Insomnia developed after she retired (!!!) 2014 .  She had some trouble with being late for work while employed.   Family status is single - divorced - ,  1 son, 3  grandchildren.  Tobacco use: none  ETOH use - none ,  Caffeine intake in form of Coffee( 1 cup in AM ) Soda( /) Tea ( /) or energy drinks Exercise: none .  Hobbies : none   Sleep habits are as follows: The patient's dinner time is between 9 PM. The patient goes to bed at 12 PM after taking ambien- and now she is awake until 4-5 AM.Rebounding insomnia.  She has not tried any other sleep aids, can't remember trazodone, elavil, gabapentin, Unsiom , or Belsomra. Once she is asleep she can only stay asleep 2-3 hours,  she is unclear as to why-  and continues to sleep for another 2 hours.  Just the last couple of weeks she  falls asleep 2-3 AM in while watching TV and gets up at 8-9 AM.-  The preferred sleep position is variable , never prone,  mostly laterally with the support of 1-2 pillows.  Dreams are reportedly  frequent/vivid since she has been out of Ambien.   Sometimes she isn't sleeping for 2-3 days - and that would be a bipolar manifestation. GERD may wake her,too. .   The patient wakes up  spontaneously/ with an alarm. 9  AM is the usual rise time.She reports not feeling refreshed or restored in AM, with symptoms such as dry mouth, no morning headaches, and residual fatigue. Naps are taken frequently, lasting from 1-3 hours  and can be  in AM-  afternoons are most  naps prone.= if the night was poor-     Review of Systems: Out of a complete 14 system review, the patient complains of only the following symptoms, and all other reviewed systems are negative.:    Chronic Insomnia  with a nearly complete day night shift since 2014 - after retirement   Ambien dependent  Fatigue, sleepiness , snoring, fragmented sleep, Insomnia  How likely are you to doze in the following situations: 0 = not likely, 1 = slight chance, 2 = moderate chance, 3 = high chance   Sitting and Reading? Watching Television? Sitting inactive in a public place  (theater or meeting)? As a passenger in a car for an hour without a break? Lying down in the afternoon when circumstances permit? Sitting and talking to someone? Sitting quietly after lunch without alcohol? In a car, while stopped for a few minutes in traffic?   Total = 2/ 24 points   FSS endorsed at 55/ 63 points.   GDS: 8/ 15 - high depression and anxiety score. !   Social History   Socioeconomic History   Marital status: Divorced    Spouse name: Not on file   Number of children: Not on file   Years of education: Not on file   Highest education level: Not on file  Occupational History   Not on file  Tobacco Use   Smoking status: Never   Smokeless tobacco: Never  Substance and Sexual Activity   Alcohol use: No   Drug use: No   Sexual activity: Not on file  Other Topics Concern   Not on file  Social History Narrative   Not on file   Social Determinants of Health   Financial Resource Strain: Not on file  Food Insecurity: Not on file  Transportation Needs: Not on file  Physical Activity: Not on file  Stress: Not on file  Social Connections: Not on file    Family History  Problem Relation Age of Onset   Breast cancer Sister 42    Past Medical History:  Diagnosis Date   Arthritis    Asthma    Depression    High cholesterol     Past Surgical History:  Procedure Laterality Date   CHOLECYSTECTOMY     COLONOSCOPY WITH PROPOFOL N/A 02/12/2017   Procedure: COLONOSCOPY WITH PROPOFOL;  Surgeon: Christena Deem, MD;  Location: [facility] ENDOSCOPY;  Service: Endoscopy;  Laterality: N/A;   ESOPHAGOGASTRODUODENOSCOPY (EGD) WITH PROPOFOL N/A 02/12/2017   Procedure: ESOPHAGOGASTRODUODENOSCOPY (EGD) WITH PROPOFOL;  Surgeon: Christena Deem, MD;  Location: [facility] ENDOSCOPY;  Service: Endoscopy;  Laterality: N/A;     Current Outpatient Medications on File Prior to Visit  Medication Sig Dispense Refill   escitalopram (LEXAPRO) 10 MG tablet Take 20 mg by mouth daily.      esomeprazole (NEXIUM) 20 MG capsule Take 20 mg by mouth daily at 12 noon.     triamcinolone (NASACORT ALLERGY 24HR) 55 MCG/ACT AERO nasal inhaler Place 2 sprays into the nose daily.     UNKNOWN TO PATIENT Unknown medication for thyroid     No current facility-administered medications on file prior to visit.    Allergies  Allergen Reactions  Penicillins Hives and Swelling    Other reaction(s): Other (See Comments)   Prednisone Other (See Comments)    Sensitivity      DIAGNOSTIC DATA (LABS, IMAGING, TESTING) - I reviewed patient records, labs, notes, testing and imaging myself where available.  Lab Results  Component Value Date   WBC 2.4 (L) 01/08/2023   HGB 10.9 (L) 01/08/2023   HCT 34.4 (L) 01/08/2023   MCV 85.1 01/08/2023   PLT 83 (L) 01/08/2023      Component Value Date/Time   NA 135 06/26/2021 1153   K 3.3 (L) 06/26/2021 1153   CL 103 06/26/2021 1153   CO2 26 06/26/2021 1153   GLUCOSE 183 (H) 06/26/2021 1153   BUN 15 06/26/2021 1153   CREATININE 0.86 06/26/2021 1153   CALCIUM 8.7 (L) 06/26/2021 1153   PROT 7.1 06/25/2021 2300   ALBUMIN 3.4 (L) 06/25/2021 2300   AST 32 06/25/2021 2300   ALT 21 06/25/2021 2300   ALKPHOS 89 06/25/2021 2300   BILITOT 1.4 (H) 06/25/2021 2300   GFRNONAA >60 06/26/2021 1153   Lab Results  Component Value Date   CHOL 158 06/26/2021   HDL 22 (L) 06/26/2021   LDLCALC 111 (H) 06/26/2021   TRIG 124 06/26/2021   CHOLHDL 7.2 06/26/2021   Lab Results  Component Value Date   HGBA1C 5.8 (H) 06/26/2021   Lab Results  Component Value Date   VITAMINB12 375 03/01/2022   No results found for: "TSH"  PHYSICAL EXAM:  Today's Vitals   03/12/23 1521  BP: (!) 130/58  Pulse: (!) 52  Weight: 186 lb (84.4 kg)  Height: 5' (1.524 m)   Body mass index is 36.33 kg/m.   Wt Readings from Last 3 Encounters:  03/12/23 186 lb (84.4 kg)  01/08/23 178 lb 11.2 oz (81.1 kg)  05/31/22 172 lb (78 kg)     Ht Readings from Last 3 Encounters:   03/12/23 5' (1.524 m)  05/31/22 5\' 2"  (1.575 m)  03/01/22 5' 2.25" (1.581 m)      General: The patient is awake, alert and appears not in acute distress. The patient is well groomed. Head: Normocephalic, atraumatic. Neck is supple.  Mallampati 3,  neck circumference:15 inches . Nasal airflow  patent.  Retrognathia is  seen.  Overbite, Bruxism marks.  Dental status:  biological.lower, upper dentures, TMJ  tenderness.  Cardiovascular:  Regular rate and cardiac rhythm by pulse,  without distended neck veins. Respiratory: Lungs are clear to auscultation.  Skin:  With evidence of ankle edema, dry skin ,  Trunk: The patient's posture is erect.   NEUROLOGIC EXAM: The patient is awake and alert, oriented to place and time.   Memory subjective described as intact.  Attention span & concentration ability appears normal.  Speech is fluent,  without  dysarthria, dysphonia or aphasia.  Mood and affect are defensive    Cranial nerves: no loss of smell or taste reported  Pupils are equal and briskly reactive to light. Funduscopic exam deferred. .  Extraocular movements in vertical and horizontal planes were intact and without nystagmus. No Diplopia. Visual fields by finger perimetry are intact. Hearing severely impaired to soft voice.   Facial sensation intact to fine touch.  Facial motor strength is symmetric and tongue and uvula move midline.  Neck ROM : rotation, tilt and flexion extension were normal for age and shoulder shrug was symmetrical.    Motor exam:  Symmetric bulk, tone and ROM.   Normal tone without cog- wheeling,  symmetric grip strength .   Sensory:  Fine touch, pinprick and vibration were tested  and  normal.  Proprioception tested in the upper extremities was normal.   Coordination: Rapid alternating movements in the fingers/hands were of normal speed.  The Finger-to-nose maneuver was intact without evidence of ataxia, dysmetria or tremor.   Gait and station: Patient  could rise unassisted from a seated position, walked without assistive device.  Stance is of normal width/ base .  Toe and heel walk were deferred.  Deep tendon reflexes: in the  upper and lower extremities are symmetric and intact.    ASSESSMENT AND PLAN 77 y.o. year old female  here with:    1) chronic insomnia of nearly 10 years,  developing alongsite her anxiety and depression,  exacerbated by loss of structure in retirement.    2) there is no physical condition that now keeps her waking - neither GERD, nor coughing , not joint pain.   3) I am surprised to hear that she wasn't referred to psychology or CBT.    Plan :  rule out apnea.  I cannot invite the patient that is a daytime sleep and rather sporadic sleeper into the sleep lab with an expectation of finding an organic sleep disorder.  She denies having restless legs, her coughing has resolved, her GERD is currently not waking her up.  She is a grinder she has a history of bruxism but she also has upper dentures that she takes out at night so there is very little risk for her to damage her teeth she would rather damage her gums this way.  She does not have TMJ pain or clicking.  She describes phasic changes in her insomnia which I think are related to anxiety or depression most likely.  I will order a home sleep test for this patient with the goal that she can use it when her sleep time occurs at home and she can bring it back to [place] 24 hours after the recording.    So this would be a screening test for sleep apnea only but the only feasible way to test her and hopefully get all about 4 hours of recording.  If sleep apnea is not present it would be feasible to refer her to cognitive behavioral therapy.  I will also be happy to tell her primary care physician about some nonhabit-forming sleep medications that could be good options.    Chronic insomnia however is usually best treated by cognitive behavior therapy, the implementation of  routines of rules of bedtime and rise time the elimination of screens out of the bedroom, to keep the bedroom cool, quiet and dark and eliminate outside stimuli.  The high depression score is noted . See ROS>    Patient is a daytime sleeper and chronic circadian rhythm / insomnia disorder , took [place] for years. Goal is to rule out organic sleep disorders  before she can see cognitive behavior therapy.  I plan to follow up only if the HST indicates OSA to be present-   through our NP within 3-5 months.    Loreli Slot, Pa-c [address] [place],  [place] [address]    After spending a total time of  45  minutes face to face and additional time for physical and neurologic examination, review of laboratory studies,  personal review of imaging studies, reports and results of other testing and review of referral information / records as far as provided in  visit,   Electronically signed by: Melvyn Novas, MD 03/12/2023 3:45 PM  [facility] Sleep Board certified by The [employer] of Sleep Medicine and Diplomate of the [employer] of Sleep Medicine. Board certified In Neurology through the ABPN, Fellow of the [employer] of Neurology.

## 2023-03-12 NOTE — Patient Instructions (Signed)
Insomnia Insomnia is a sleep disorder that makes it difficult to fall asleep or stay asleep. Insomnia can cause fatigue, low energy, difficulty concentrating, mood swings, and poor performance at work or school. There are three different ways to classify insomnia: Difficulty falling asleep. Difficulty staying asleep. Waking up too early in the morning. Any type of insomnia can be long-term (chronic) or short-term (acute). Both are common. Short-term insomnia usually lasts for 3 months or less. Chronic insomnia occurs at least three times a week for longer than 3 months. What are the causes? Insomnia may be caused by another condition, situation, or substance, such as: Having certain mental health conditions, such as anxiety and depression. Using caffeine, alcohol, tobacco, or drugs. Having gastrointestinal conditions, such as gastroesophageal reflux disease (GERD). Having certain medical conditions. These include: Asthma. Alzheimer's disease. Stroke. Chronic pain. An overactive thyroid gland (hyperthyroidism). Other sleep disorders, such as restless legs syndrome and sleep apnea. Menopause. Sometimes, the cause of insomnia may not be known. What increases the risk? Risk factors for insomnia include: Gender. Females are affected more often than males. Age. Insomnia is more common as people get older. Stress and certain medical and mental health conditions. Lack of exercise. Having an irregular work schedule. This may include working night shifts and traveling between different time zones. What are the signs or symptoms? If you have insomnia, the main symptom is having trouble falling asleep or having trouble staying asleep. This may lead to other symptoms, such as: Feeling tired or having low energy. Feeling nervous about going to sleep. Not feeling rested in the morning. Having trouble concentrating. Feeling irritable, anxious, or depressed. How is this diagnosed? This  condition may be diagnosed based on: Your symptoms and medical history. Your health care provider may ask about: Your sleep habits. Any medical conditions you have. Your mental health. A physical exam. How is this treated? Treatment for insomnia depends on the cause. Treatment may focus on treating an underlying condition that is causing the insomnia. Treatment may also include: Medicines to help you sleep. Counseling or therapy. Lifestyle adjustments to help you sleep better. Follow these instructions at home: Eating and drinking  Limit or avoid alcohol, caffeinated beverages, and products that contain nicotine and tobacco, especially close to bedtime. These can disrupt your sleep. Do not eat a large meal or eat spicy foods right before bedtime. This can lead to digestive discomfort that can make it hard for you to sleep. Sleep habits  Keep a sleep diary to help you and your health care provider figure out what could be causing your insomnia. Write down: When you sleep. When you wake up during the night. How well you sleep and how rested you feel the next day. Any side effects of medicines you are taking. What you eat and drink. Make your bedroom a dark, comfortable place where it is easy to fall asleep. Put up shades or blackout curtains to block light from outside. Use a white noise machine to block noise. Keep the temperature cool. Limit screen use before bedtime. This includes: Not watching TV. Not using your smartphone, tablet, or computer. Stick to a routine that includes going to bed and waking up at the same times every day and night. This can help you fall asleep faster. Consider making a quiet activity, such as reading, part of your nighttime routine. Try to avoid taking naps during the day so that you sleep better at night. Get out of bed if you  are still awake after 15 minutes of trying to sleep. Keep the lights down, but try reading or doing a quiet activity. When you  feel sleepy, go back to bed. General instructions Take over-the-counter and prescription medicines only as told by your health care provider. Exercise regularly as told by your health care provider. However, avoid exercising in the hours right before bedtime. Use relaxation techniques to manage stress. Ask your health care provider to suggest some techniques that may work well for you. These may include: Breathing exercises. Routines to release muscle tension. Visualizing peaceful scenes. Make sure that you drive carefully. Do not drive if you feel very sleepy. Keep all follow-up visits. This is important. Contact a health care provider if: You are tired throughout the day. You have trouble in your daily routine due to sleepiness. You continue to have sleep problems, or your sleep problems get worse. Get help right away if: You have thoughts about hurting yourself or someone else. Get help right away if you feel like you may hurt yourself or others, or have thoughts about taking your own life. Go to your nearest emergency room or: Call 911. Call the National Suicide Prevention Lifeline at (443)143-6489 or 988. This is open 24 hours a day. Text the Crisis Text Line at 680-396-7005. Summary Insomnia is a sleep disorder that makes it difficult to fall asleep or stay asleep. Insomnia can be long-term (chronic) or short-term (acute). Treatment for insomnia depends on the cause. Treatment may focus on treating an underlying condition that is causing the insomnia. Keep a sleep diary to help you and your health care provider figure out what could be causing your insomnia. This information is not intended to replace advice given to you by your health care provider. Make sure you discuss any questions you have with your health care provider. Document Revised: 07/24/2021 Document Reviewed: 07/24/2021 Elsevier Patient Education  2024 Elsevier Inc. Patient is a daytime sleeper and chronic circadian rhythm  / insomnia disorder , took [place] for years. Goal is to rule out organic sleep disorders  before she can see cognitive behavior therapy.

## 2023-04-09 ENCOUNTER — Ambulatory Visit: Payer: Medicare PPO | Admitting: Neurology

## 2023-04-09 DIAGNOSIS — G4733 Obstructive sleep apnea (adult) (pediatric): Secondary | ICD-10-CM | POA: Diagnosis not present

## 2023-04-09 DIAGNOSIS — Z9189 Other specified personal risk factors, not elsewhere classified: Secondary | ICD-10-CM

## 2023-04-09 DIAGNOSIS — F5104 Psychophysiologic insomnia: Secondary | ICD-10-CM

## 2023-04-09 DIAGNOSIS — G4724 Circadian rhythm sleep disorder, free running type: Secondary | ICD-10-CM

## 2023-04-10 NOTE — Progress Notes (Signed)
[facility]   HOME SLEEP TEST REPORT ( by Watch PAT)   STUDY DATE:  04-10-2023 [name] 77 year old female 16-Sep-1945    ORDERING CLINICIAN:  Melvyn Novas, MD  REFERRING CLINICIAN:    CLINICAL INFORMATION/HISTORY: Veronica Wright is a 77 y.o. female patient who is seen upon referral on 03/12/2023 from [facility] [place], In [place]-  she wants her evaluated for an organic sleep disorders.   Chief concern according to patient : " I developed chronic cough after a Covid infection for 2 years, it stopped on its own.  For a couple of years she would wake every night to cough,  having a tickling in her throat, pulmonology has followed ( June 2023). Chronic Insomnia is well documented , years of Ambien use.    Then ,in January ,she was referred to [facility] Dr Sherryll Burger in [place], Neurology-  for a sleep evaluation.  She can't remember seeing him, may be it was a tele visit? .    Epworth sleepiness score: Total = 2/ 24 points  FSS endorsed at 55/ 63 points.  GDS: 8/ 15 - high depression and anxiety score.    BMI:  36.4/h kg/m   Neck Circumference: 15"   FINDINGS:   Sleep Summary:   Total Recording Time (hours, min):   9 hours 11 minutes  Total Sleep Time (hours, min):   7 hours 16 minutes              Percent REM (%): 8.6%                                       Respiratory Indices by AASM and CMS criteria:   Calculated pAHI (per hour):    AASM 24/h, CMS AHI 12.2/h                         REM pAHI: AASM AHI 45/h, CMS AHI 17.7/h                                                NREM pAHI:   AASM AHI 22/h, CMS AHI 11.6/h                           Positional AHI: The patient slept almost exclusively in supine with an AHI by AASM criteria of 24.4/h and by CMS criteria 11.3/h                                                 Oxygen Saturation Statistics:    O2 Saturation Range (%):   Between the nadir at 85% of the maximal saturation of 99% with a mean saturation of  95%.                                    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  CMS AHI 12.2/h .                      There is no associated hypoxia and the patient slept 7 hours and 16 minutes according to this recording. 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   RECOMMENDATION: Auto titration CPAP device with a setting between 6 and 16 cm water pressure 3 cm EPR, heated humidification and interface of patient's comfort and choice. Revisit within days 60 and day 90 of therapy.  Please reinforce with the patient compliance which is defined as 4 hours or more of CPAP use every night.  This patient can also definitely benefit from weight loss with an BMI of 36 she is considered at a much higher risk of apnea and especially REM sleep dependent apnea.    INTERPRETING PHYSICIAN:   Melvyn Novas, MD

## 2023-04-15 ENCOUNTER — Other Ambulatory Visit: Payer: Self-pay | Admitting: *Deleted

## 2023-04-15 DIAGNOSIS — D61818 Other pancytopenia: Secondary | ICD-10-CM

## 2023-04-16 ENCOUNTER — Encounter: Payer: Self-pay | Admitting: Oncology

## 2023-04-16 ENCOUNTER — Inpatient Hospital Stay (HOSPITAL_BASED_OUTPATIENT_CLINIC_OR_DEPARTMENT_OTHER): Payer: Medicare PPO | Admitting: Oncology

## 2023-04-16 ENCOUNTER — Inpatient Hospital Stay: Payer: Medicare PPO | Attending: Oncology

## 2023-04-16 VITALS — BP 141/61 | HR 53 | Temp 97.9°F | Resp 16 | Ht 60.0 in | Wt 181.2 lb

## 2023-04-16 DIAGNOSIS — D61818 Other pancytopenia: Secondary | ICD-10-CM | POA: Insufficient documentation

## 2023-04-16 LAB — CBC WITH DIFFERENTIAL/PLATELET
Abs Immature Granulocytes: 0 10*3/uL (ref 0.00–0.07)
Basophils Absolute: 0 10*3/uL (ref 0.0–0.1)
Basophils Relative: 1 %
Eosinophils Absolute: 0.1 10*3/uL (ref 0.0–0.5)
Eosinophils Relative: 4 %
HCT: 33.3 % — ABNORMAL LOW (ref 36.0–46.0)
Hemoglobin: 10.8 g/dL — ABNORMAL LOW (ref 12.0–15.0)
Immature Granulocytes: 0 %
Lymphocytes Relative: 38 %
Lymphs Abs: 0.9 10*3/uL (ref 0.7–4.0)
MCH: 27.6 pg (ref 26.0–34.0)
MCHC: 32.4 g/dL (ref 30.0–36.0)
MCV: 84.9 fL (ref 80.0–100.0)
Monocytes Absolute: 0.3 10*3/uL (ref 0.1–1.0)
Monocytes Relative: 13 %
Neutro Abs: 1 10*3/uL — ABNORMAL LOW (ref 1.7–7.7)
Neutrophils Relative %: 44 %
Platelets: 70 10*3/uL — ABNORMAL LOW (ref 150–400)
RBC: 3.92 MIL/uL (ref 3.87–5.11)
RDW: 14.5 % (ref 11.5–15.5)
WBC: 2.4 10*3/uL — ABNORMAL LOW (ref 4.0–10.5)
nRBC: 0 % (ref 0.0–0.2)

## 2023-04-16 NOTE — Progress Notes (Unsigned)
[facility]  Telephone:(336) [phone] Fax:(336) [phone]  ID: Veronica Wright OB: 07/18/1946  MR#: [id number]  UYQ#:[id number]  Patient Care Team: Donavan Burnet as PCP - General (Physician Assistant)  CHIEF COMPLAINT: Pancytopenia.   INTERVAL HISTORY: Patient returns to clinic today for repeat laboratory work and routine 72-month evaluation.  She continues to have chronic fatigue, but otherwise feels well.  She has no neurologic complaints.  She denies any recent fevers or illnesses. She has a good appetite and denies weight loss.  She has no chest pain, shortness of breath, cough, or hemoptysis.  She denies any nausea, vomiting, constipation, or diarrhea.  She has no urinary complaints.  Patient offers no further specific complaints today.  REVIEW OF SYSTEMS:   Review of Systems  Constitutional:  Positive for malaise/fatigue. Negative for fever and weight loss.  Respiratory: Negative.  Negative for cough, hemoptysis and shortness of breath.   Cardiovascular: Negative.  Negative for chest pain and leg swelling.  Gastrointestinal: Negative.  Negative for abdominal pain.  Genitourinary: Negative.  Negative for dysuria.  Musculoskeletal: Negative.  Negative for back pain.  Skin: Negative.  Negative for rash.  Neurological: Negative.  Negative for dizziness, focal weakness, weakness and headaches.  Psychiatric/Behavioral:  The patient has insomnia. The patient is not nervous/anxious.     As per HPI. Otherwise, a complete review of systems is negative.  PAST MEDICAL HISTORY: Past Medical History:  Diagnosis Date   Arthritis    Asthma    Depression    High cholesterol     PAST SURGICAL HISTORY: Past Surgical History:  Procedure Laterality Date   CHOLECYSTECTOMY     COLONOSCOPY WITH PROPOFOL N/A 02/12/2017   Procedure: COLONOSCOPY WITH PROPOFOL;  Surgeon: Christena Deem, MD;  Location: [facility] ENDOSCOPY;  Service: Endoscopy;  Laterality: N/A;    ESOPHAGOGASTRODUODENOSCOPY (EGD) WITH PROPOFOL N/A 02/12/2017   Procedure: ESOPHAGOGASTRODUODENOSCOPY (EGD) WITH PROPOFOL;  Surgeon: Christena Deem, MD;  Location: [facility] ENDOSCOPY;  Service: Endoscopy;  Laterality: N/A;    FAMILY HISTORY: Family History  Problem Relation Age of Onset   Breast cancer Sister 52    ADVANCED DIRECTIVES (Y/N):  N  HEALTH MAINTENANCE: Social History   Tobacco Use   Smoking status: Never   Smokeless tobacco: Never  Substance Use Topics   Alcohol use: No   Drug use: No     Colonoscopy:  PAP:  Bone density:  Lipid panel:  Allergies  Allergen Reactions   Penicillins Hives and Swelling    Other reaction(s): Other (See Comments)   Prednisone Other (See Comments)    Sensitivity     Current Outpatient Medications  Medication Sig Dispense Refill   escitalopram (LEXAPRO) 10 MG tablet Take 20 mg by mouth daily.     esomeprazole (NEXIUM) 20 MG capsule Take 20 mg by mouth daily at 12 noon.     levothyroxine (SYNTHROID) 50 MCG tablet Take 50 mcg by mouth daily before breakfast.     triamcinolone (NASACORT ALLERGY 24HR) 55 MCG/ACT AERO nasal inhaler Place 2 sprays into the nose daily.     No current facility-administered medications for this visit.    OBJECTIVE: Vitals:   04/16/23 1510  BP: (!) 141/61  Pulse: (!) 53  Resp: 16  Temp: 97.9 F (36.6 C)  SpO2: 97%     Body mass index is 35.39 kg/m.    ECOG FS:0 - Asymptomatic  General: Well-developed, well-nourished, no acute distress. Eyes: Pink conjunctiva, anicteric sclera. HEENT: Normocephalic, moist mucous membranes.  Lungs: No audible wheezing or coughing. Heart: Regular rate and rhythm. Abdomen: Soft, nontender, no obvious distention. Musculoskeletal: No edema, cyanosis, or clubbing. Neuro: Alert, answering all questions appropriately. Cranial nerves grossly intact. Skin: No rashes or petechiae noted. Psych: Normal affect.  LAB RESULTS:  Lab Results  Component Value Date   NA  135 06/26/2021   K 3.3 (L) 06/26/2021   CL 103 06/26/2021   CO2 26 06/26/2021   GLUCOSE 183 (H) 06/26/2021   BUN 15 06/26/2021   CREATININE 0.86 06/26/2021   CALCIUM 8.7 (L) 06/26/2021   PROT 7.1 06/25/2021   ALBUMIN 3.4 (L) 06/25/2021   AST 32 06/25/2021   ALT 21 06/25/2021   ALKPHOS 89 06/25/2021   BILITOT 1.4 (H) 06/25/2021   GFRNONAA >60 06/26/2021    Lab Results  Component Value Date   WBC 2.4 (L) 04/16/2023   NEUTROABS 1.0 (L) 04/16/2023   HGB 10.8 (L) 04/16/2023   HCT 33.3 (L) 04/16/2023   MCV 84.9 04/16/2023   PLT 70 (L) 04/16/2023     STUDIES: No results found.  ASSESSMENT: Pancytopenia.   PLAN:    Leukopenia, unspecified: Chronic and unchanged.  Patient's total white blood cell count is 2.4 today.  All of her other laboratory work including peripheral blood flow cytometry was either negative or within normal limits. 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  It is possible patient has underlying MDS, but this will take a bone marrow biopsy to diagnose which which patient has agreed to and will be completed after the Labor Day holiday.  Return to clinic 2 weeks after her biopsy to discuss the results.  Anemia: Chronic and unchanged.  Patient's hemoglobin is 10.8 today. Thrombocytopenia: Platelets have trended down slightly to 70.  Bone marrow biopsy as above.  I spent a total of 30 minutes reviewing chart data, face-to-face evaluation with the patient, counseling and coordination of care as detailed above.  Patient expressed understanding and was in agreement with this plan. She also understands that She can call clinic at any time with any questions, concerns, or complaints.    Jeralyn Ruths, MD   04/17/2023 9:46 AM

## 2023-04-17 ENCOUNTER — Encounter: Payer: Self-pay | Admitting: *Deleted

## 2023-04-22 ENCOUNTER — Telehealth: Payer: Self-pay

## 2023-04-22 NOTE — Telephone Encounter (Signed)
Pt LVM on sleep lab phone asking about results from home sleep study

## 2023-04-23 NOTE — Procedures (Signed)
[facility]   HOME SLEEP TEST REPORT ( by Watch PAT)   STUDY DATE:  04-10-2023 Veronica Wright 77 year old female 10-17-1945    ORDERING CLINICIAN:  Melvyn Novas, MD  REFERRING CLINICIAN:    CLINICAL INFORMATION/HISTORY: Veronica Wright is a 77 y.o. female patient who is seen upon referral on 03/12/2023 from [place], In [place]-  she wants her evaluated for an organic sleep disorders.   Chief concern according to patient : " I developed chronic cough after a Covid infection for 2 years, it stopped on its own.  For a couple of years she would wake every night to cough,  having a tickling in her throat, pulmonology has followed ( June 2023). Chronic Insomnia is well documented , years of Ambien use.    Then ,in January ,she was referred to [facility] Dr Sherryll Burger in [place], Neurology-  for a sleep evaluation.  She can't remember seeing him, may be it was a tele visit? .    Epworth sleepiness score: Total = 2/ 24 points  FSS endorsed at 55/ 63 points.  GDS: 8/ 15 - high depression and anxiety score.    BMI:  36.4/h kg/m   Neck Circumference: 15"   FINDINGS:   Sleep Summary:   Total Recording Time (hours, min):   9 hours 11 minutes  Total Sleep Time (hours, min):   7 hours 16 minutes              Percent REM (%): 8.6%                                       Respiratory Indices by AASM and CMS criteria:   Calculated pAHI (per hour):    AASM 24/h, CMS AHI 12.2/h                         REM pAHI: AASM AHI 45/h, CMS AHI 17.7/h                                                NREM pAHI:   AASM AHI 22/h, CMS AHI 11.6/h                           Positional AHI: The patient slept almost exclusively in supine with an AHI by AASM criteria of 24.4/h and by CMS criteria 11.3/h                                                 Oxygen Saturation Statistics:    O2 Saturation Range (%):   Between the nadir at 85% of the maximal saturation of 99% with a mean saturation of 95%.                                     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  CMS AHI 12.2/h .                      There is no associated hypoxia and the patient slept 7 hours and 16 minutes according to this recording. 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   RECOMMENDATION: Auto titration CPAP device with a setting between 6 and 16 cm water pressure 3 cm EPR, heated humidification and interface of patient's comfort and choice. Revisit within days 60 and day 90 of therapy.  Please reinforce with the patient compliance which is defined as 4 hours or more of CPAP use every night.  This patient can also definitely benefit from weight loss with an BMI of 36 she is considered at a much higher risk of apnea and especially REM sleep dependent apnea.  This sleep study documented a high sleep efficiency without evidence of insomnia.    INTERPRETING PHYSICIAN:   Melvyn Novas, MD

## 2023-04-23 NOTE — Addendum Note (Signed)
Addended by: Melvyn Novas on: 04/23/2023 05:14 PM   Modules accepted: Orders

## 2023-05-13 ENCOUNTER — Telehealth: Payer: Self-pay

## 2023-05-13 NOTE — Telephone Encounter (Signed)
PLEASE SEE MYCHART MESSAGE PRIOR TO CALLING

## 2023-05-13 NOTE — Telephone Encounter (Signed)
Patient has left a VM on the sleep lab phone about setting up an appointment with Dr. Vickey Huger, please contact patient back at [phone].

## 2023-05-14 NOTE — Telephone Encounter (Signed)
Addressed with pt through [employer]

## 2023-05-21 NOTE — Telephone Encounter (Signed)
Order has been sent to [facility] for the pt.

## 2023-07-17 ENCOUNTER — Other Ambulatory Visit: Payer: Self-pay | Admitting: *Deleted

## 2023-07-17 DIAGNOSIS — D72819 Decreased white blood cell count, unspecified: Secondary | ICD-10-CM

## 2023-07-18 ENCOUNTER — Inpatient Hospital Stay: Payer: Medicare PPO | Attending: Oncology

## 2023-10-25 ENCOUNTER — Telehealth: Payer: Self-pay | Admitting: *Deleted

## 2023-10-25 ENCOUNTER — Inpatient Hospital Stay: Payer: Medicare PPO

## 2023-10-25 ENCOUNTER — Inpatient Hospital Stay: Payer: Medicare PPO | Admitting: Oncology

## 2023-10-25 ENCOUNTER — Telehealth: Payer: Self-pay | Admitting: Oncology

## 2023-10-25 NOTE — Telephone Encounter (Signed)
 Patient left voicemail that she can't do morning appointments due to insomnia. She requested her appointment be changed to an afternoon. I have changed appointments as requested and left her a voicemail with new appointments

## 2023-10-25 NOTE — Telephone Encounter (Signed)
 Pt. Sent message to scheduler to new appt for the patient. She has insomnia. The pt. Already has the next appt.

## 2023-10-30 ENCOUNTER — Encounter: Payer: Self-pay | Admitting: Oncology

## 2023-10-30 ENCOUNTER — Inpatient Hospital Stay: Payer: Medicare PPO | Attending: Oncology

## 2023-10-30 ENCOUNTER — Inpatient Hospital Stay: Payer: Medicare PPO | Admitting: Oncology

## 2023-10-30 VITALS — BP 171/70 | HR 55 | Temp 98.1°F | Resp 16 | Ht 60.0 in | Wt 179.0 lb

## 2023-10-30 DIAGNOSIS — D61818 Other pancytopenia: Secondary | ICD-10-CM | POA: Insufficient documentation

## 2023-10-30 DIAGNOSIS — D72819 Decreased white blood cell count, unspecified: Secondary | ICD-10-CM

## 2023-10-30 LAB — CBC WITH DIFFERENTIAL/PLATELET
Abs Immature Granulocytes: 0.01 10*3/uL (ref 0.00–0.07)
Basophils Absolute: 0 10*3/uL (ref 0.0–0.1)
Basophils Relative: 1 %
Eosinophils Absolute: 0.1 10*3/uL (ref 0.0–0.5)
Eosinophils Relative: 3 %
HCT: 34.7 % — ABNORMAL LOW (ref 36.0–46.0)
Hemoglobin: 11.5 g/dL — ABNORMAL LOW (ref 12.0–15.0)
Immature Granulocytes: 0 %
Lymphocytes Relative: 40 %
Lymphs Abs: 1.6 10*3/uL (ref 0.7–4.0)
MCH: 28 pg (ref 26.0–34.0)
MCHC: 33.1 g/dL (ref 30.0–36.0)
MCV: 84.6 fL (ref 80.0–100.0)
Monocytes Absolute: 0.5 10*3/uL (ref 0.1–1.0)
Monocytes Relative: 13 %
Neutro Abs: 1.7 10*3/uL (ref 1.7–7.7)
Neutrophils Relative %: 43 %
Platelets: 77 10*3/uL — ABNORMAL LOW (ref 150–400)
RBC: 4.1 MIL/uL (ref 3.87–5.11)
RDW: 15.2 % (ref 11.5–15.5)
WBC: 3.9 10*3/uL — ABNORMAL LOW (ref 4.0–10.5)
nRBC: 0 % (ref 0.0–0.2)

## 2023-10-30 NOTE — Progress Notes (Signed)
 Veronica Wright  Telephone:(336) [phone] Fax:(336) [phone]  ID: Veronica Wright OB: June 12, 1946  MR#: [id number]  AOZ#:[id number]  Patient Care Team: Veronica Wright as PCP - General (Physician Assistant) Veronica Ruths, MD as Consulting Physician (Oncology)  CHIEF COMPLAINT: Pancytopenia.   INTERVAL HISTORY: Patient returns to clinic today for repeat laboratory work and routine 52-month evaluation.  She continues to have fatigue and insomnia, but otherwise feels well.  She has no neurologic complaints.  She denies any recent fevers or illnesses. She has a good appetite and denies weight loss.  She has no chest pain, shortness of breath, cough, or hemoptysis.  She denies any nausea, vomiting, constipation, or diarrhea.  She has no urinary complaints.  Patient offers no further specific complaints today.  REVIEW OF SYSTEMS:   Review of Systems  Constitutional:  Positive for malaise/fatigue. Negative for fever and weight loss.  Respiratory: Negative.  Negative for cough, hemoptysis and shortness of breath.   Cardiovascular: Negative.  Negative for chest pain and leg swelling.  Gastrointestinal: Negative.  Negative for abdominal pain.  Genitourinary: Negative.  Negative for dysuria.  Musculoskeletal: Negative.  Negative for back pain.  Skin: Negative.  Negative for rash.  Neurological: Negative.  Negative for dizziness, focal weakness, weakness and headaches.  Psychiatric/Behavioral:  The patient has insomnia. The patient is not nervous/anxious.     As per HPI. Otherwise, a complete review of systems is negative.  PAST MEDICAL HISTORY: Past Medical History:  Diagnosis Date   Arthritis    Asthma    Depression    High cholesterol     PAST SURGICAL HISTORY: Past Surgical History:  Procedure Laterality Date   CHOLECYSTECTOMY     COLONOSCOPY WITH PROPOFOL N/A 02/12/2017   Procedure: COLONOSCOPY WITH PROPOFOL;  Surgeon: Christena Deem, MD;  Location: [facility]  ENDOSCOPY;  Service: Endoscopy;  Laterality: N/A;   ESOPHAGOGASTRODUODENOSCOPY (EGD) WITH PROPOFOL N/A 02/12/2017   Procedure: ESOPHAGOGASTRODUODENOSCOPY (EGD) WITH PROPOFOL;  Surgeon: Christena Deem, MD;  Location: [facility] ENDOSCOPY;  Service: Endoscopy;  Laterality: N/A;    FAMILY HISTORY: Family History  Problem Relation Age of Onset   Breast cancer Sister 64    ADVANCED DIRECTIVES (Y/N):  N  HEALTH MAINTENANCE: Social History   Tobacco Use   Smoking status: Never   Smokeless tobacco: Never  Substance Use Topics   Alcohol use: No   Drug use: No     Colonoscopy:  PAP:  Bone density:  Lipid panel:  Allergies  Allergen Reactions   Penicillins Hives and Swelling    Other reaction(s): Other (See Comments)   Prednisone Other (See Comments)    Sensitivity     Current Outpatient Medications  Medication Sig Dispense Refill   escitalopram (LEXAPRO) 10 MG tablet Take 20 mg by mouth daily.     esomeprazole (NEXIUM) 20 MG capsule Take 20 mg by mouth daily at 12 noon.     levothyroxine (SYNTHROID) 50 MCG tablet Take 50 mcg by mouth daily before breakfast.     triamcinolone (NASACORT ALLERGY 24HR) 55 MCG/ACT AERO nasal inhaler Place 2 sprays into the nose daily.     No current facility-administered medications for this visit.    OBJECTIVE: Vitals:   10/30/23 1418 10/30/23 1421  BP: (!) 178/61   Pulse: (!) 55   Resp: 16   Temp: 98.1 F (36.7 C) 98.1 F (36.7 C)  SpO2: 98%      Body mass index is 34.96 kg/m.    ECOG FS:0 -  Asymptomatic  General: Well-developed, well-nourished, no acute distress. Eyes: Pink conjunctiva, anicteric sclera. HEENT: Normocephalic, moist mucous membranes. Lungs: No audible wheezing or coughing. Heart: Regular rate and rhythm. Abdomen: Soft, nontender, no obvious distention. Musculoskeletal: No edema, cyanosis, or clubbing. Neuro: Alert, answering all questions appropriately. Cranial nerves grossly intact. Skin: No rashes or petechiae  noted. Psych: Normal affect.  LAB RESULTS:  Lab Results  Component Value Date   NA 135 06/26/2021   K 3.3 (L) 06/26/2021   CL 103 06/26/2021   CO2 26 06/26/2021   GLUCOSE 183 (H) 06/26/2021   BUN 15 06/26/2021   CREATININE 0.86 06/26/2021   CALCIUM 8.7 (L) 06/26/2021   PROT 7.1 06/25/2021   ALBUMIN 3.4 (L) 06/25/2021   AST 32 06/25/2021   ALT 21 06/25/2021   ALKPHOS 89 06/25/2021   BILITOT 1.4 (H) 06/25/2021   GFRNONAA >60 06/26/2021    Lab Results  Component Value Date   WBC 3.9 (L) 10/30/2023   NEUTROABS 1.7 10/30/2023   HGB 11.5 (L) 10/30/2023   HCT 34.7 (L) 10/30/2023   MCV 84.6 10/30/2023   PLT 77 (L) 10/30/2023     STUDIES: No results found.  ASSESSMENT: Pancytopenia.   PLAN:    Leukopenia, unspecified: Chronic and unchanged.  Patient's total white blood cell count has ranged between 2.4 and 3.9 since July 2023.  Her most recent result is 3.9.  Previously, all of her other laboratory work including peripheral blood flow cytometry was either negative or within normal limits. 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  It is possible patient has underlying MDS, but this will take a bone marrow biopsy to diagnose which is not necessary at this point.  Anemia: Chronic and unchanged.  Patient's hemoglobin has only been mildly reduced ranging between 10.8 and 11.5 since July 2023.  Her most result is 11.5. Thrombocytopenia: Chronic and unchanged.  Patient's platelet count has ranged between 70 and 107 since July 2023.  Her most recent result is 58. Disposition: No follow-up has been scheduled.  Recommend monitoring CBC 2 times per year and if there are any questions or concerns please refer patient back for further evaluation.  Patient expressed understanding and was in agreement with this plan. She also understands that She can call clinic at any time with any questions, concerns, or complaints.    Veronica Ruths, MD   10/30/2023 2:40  PM

## 2024-06-16 ENCOUNTER — Other Ambulatory Visit: Payer: Self-pay | Admitting: Physician Assistant

## 2024-06-16 DIAGNOSIS — R17 Unspecified jaundice: Secondary | ICD-10-CM

## 2024-06-17 ENCOUNTER — Ambulatory Visit
Admission: RE | Admit: 2024-06-17 | Discharge: 2024-06-17 | Disposition: A | Discharge status: Home / Self Care | Source: Ambulatory Visit | Attending: Physician Assistant | Admitting: Physician Assistant

## 2024-06-17 DIAGNOSIS — R17 Unspecified jaundice: Secondary | ICD-10-CM | POA: Insufficient documentation

## 2024-06-17 MED ORDER — IOHEXOL 300 MG/ML  SOLN
100.0000 mL | Freq: Once | INTRAMUSCULAR | Status: AC | PRN
  Administered 2024-06-17: 100 mL via INTRAVENOUS

## 2024-07-27 ENCOUNTER — Other Ambulatory Visit: Payer: Self-pay

## 2024-07-27 ENCOUNTER — Emergency Department
Admission: EM | Admit: 2024-07-27 | Discharge: 2024-07-27 | Disposition: A | Discharge status: Home / Self Care | Attending: Emergency Medicine | Admitting: Emergency Medicine

## 2024-07-27 ENCOUNTER — Encounter: Payer: Self-pay | Admitting: *Deleted

## 2024-07-27 ENCOUNTER — Emergency Department

## 2024-07-27 DIAGNOSIS — R945 Abnormal results of liver function studies: Secondary | ICD-10-CM | POA: Insufficient documentation

## 2024-07-27 DIAGNOSIS — N179 Acute kidney failure, unspecified: Secondary | ICD-10-CM | POA: Insufficient documentation

## 2024-07-27 DIAGNOSIS — R799 Abnormal finding of blood chemistry, unspecified: Secondary | ICD-10-CM | POA: Diagnosis present

## 2024-07-27 DIAGNOSIS — J45909 Unspecified asthma, uncomplicated: Secondary | ICD-10-CM | POA: Insufficient documentation

## 2024-07-27 DIAGNOSIS — N189 Chronic kidney disease, unspecified: Secondary | ICD-10-CM | POA: Diagnosis not present

## 2024-07-27 LAB — COMPREHENSIVE METABOLIC PANEL WITH GFR
ALT: 25 U/L (ref 0–44)
AST: 61 U/L — ABNORMAL HIGH (ref 15–41)
Albumin: 2.9 g/dL — ABNORMAL LOW (ref 3.5–5.0)
Alkaline Phosphatase: 107 U/L (ref 38–126)
Anion gap: 8 (ref 5–15)
BUN: 12 mg/dL (ref 8–23)
CO2: 24 mmol/L (ref 22–32)
Calcium: 8.3 mg/dL — ABNORMAL LOW (ref 8.9–10.3)
Chloride: 107 mmol/L (ref 98–111)
Creatinine, Ser: 1.3 mg/dL — ABNORMAL HIGH (ref 0.44–1.00)
GFR, Estimated: 42 mL/min — ABNORMAL LOW (ref >60)
Glucose, Bld: 95 mg/dL (ref 70–99)
Potassium: 3.5 mmol/L (ref 3.5–5.1)
Sodium: 140 mmol/L (ref 135–145)
Total Bilirubin: 2.3 mg/dL — ABNORMAL HIGH (ref 0.0–1.2)
Total Protein: 6.1 g/dL — ABNORMAL LOW (ref 6.5–8.1)

## 2024-07-27 LAB — CBC
HCT: 32.4 % — ABNORMAL LOW (ref 36.0–46.0)
Hemoglobin: 10.7 g/dL — ABNORMAL LOW (ref 12.0–15.0)
MCH: 29.6 pg (ref 26.0–34.0)
MCHC: 33 g/dL (ref 30.0–36.0)
MCV: 89.5 fL (ref 80.0–100.0)
Platelets: 73 K/uL — ABNORMAL LOW (ref 150–400)
RBC: 3.62 MIL/uL — ABNORMAL LOW (ref 3.87–5.11)
RDW: 16.3 % — ABNORMAL HIGH (ref 11.5–15.5)
WBC: 3.5 K/uL — ABNORMAL LOW (ref 4.0–10.5)
nRBC: 0 % (ref 0.0–0.2)

## 2024-07-27 LAB — LIPASE, BLOOD: Lipase: 31 U/L (ref 11–51)

## 2024-07-27 MED ORDER — LACTATED RINGERS IV BOLUS
1000.0000 mL | Freq: Once | INTRAVENOUS | Status: AC
  Administered 2024-07-27: 1000 mL via INTRAVENOUS

## 2024-07-27 NOTE — ED Notes (Signed)
 Patient denies pain and is resting comfortably.

## 2024-07-27 NOTE — ED Triage Notes (Signed)
 Pt ambulatory to triage.  Pt reports her doctor called and told her to go to ER for abnormal labs-liver function elevated.  Hx diverticulitis.  Pt alert  speech clear

## 2024-07-27 NOTE — ED Provider Notes (Signed)
 [facility] Provider Note    Event Date/Time   First MD Initiated Contact with Patient 07/27/24 1855     (approximate)  History   Chief Complaint: abnormal labs  HPI  Veronica Wright is a 78 y.o. female with a past medical history of asthma, arthritis, hyperlipidemia, depression, presents to the emergency department called by her doctor for evaluation for abnormal labs.  According to the patient for the last several weeks she has been taking antibiotics for diverticulitis.  She had labs checked on the 28th and was told that her kidney function was worsening and her liver functions were elevating and she should go to the emergency department today for more thorough evaluation.  Patient has a history of chronic kidney disease.  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  No alcohol use.  Patient denies any worsening abdominal pain in fact she states her abdominal discomfort has improved significantly over the last couple weeks.  Patient had an abdominal CT at the end of October showing possible mild colitis based on my record review.  Physical Exam   Triage Vital Signs: ED Triage Vitals  Encounter Vitals Group     BP 07/27/24 1538 115/76     Girls Systolic BP Percentile --      Girls Diastolic BP Percentile --      Boys Systolic BP Percentile --      Boys Diastolic BP Percentile --      Pulse Rate 07/27/24 1538 65     Resp 07/27/24 1538 18     Temp 07/27/24 1538 97.9 F (36.6 C)     Temp Source 07/27/24 1538 Oral     SpO2 07/27/24 1538 100 %     Weight 07/27/24 1539 178 lb 9.2 oz (81 kg)     Height 07/27/24 1539 5' 2 (1.575 m)     Head Circumference --      Peak Flow --      Pain Score 07/27/24 1539 0     Pain Loc --      Pain Education --      Exclude from Growth Chart --     Most recent vital signs: Vitals:   07/27/24 1538  BP: 115/76  Pulse: 65  Resp: 18  Temp: 97.9 F (36.6 C)  SpO2: 100%    General: Awake, no distress.   CV:  Good peripheral perfusion.  Regular rate and rhythm  Resp:  Normal effort.  Equal breath sounds bilaterally.  Abd:  No distention.  Soft, nontender.  No rebound or guarding.  Largely benign abdomen.   ED Results / Procedures / Treatments   RADIOLOGY  Ultrasound shows cholecystectomy, hepatic steatosis with ascites and right pleural effusion.   MEDICATIONS ORDERED IN ED: Medications  lactated ringers bolus 1,000 mL (has no administration in time range)     IMPRESSION / MDM / ASSESSMENT AND PLAN / ED COURSE  I reviewed the triage vital signs and the nursing notes.  Patient's presentation is most consistent with acute presentation with potential threat to life or bodily function.  Patient presents to the emergency department for worsening renal function.  I was able to reviewed the patient's PCP labs it appears that her creatinine had worsened from around 1.2-1.55 on the 28th and her bilirubin had elevated as well.  Patient's repeat labs today show actually some improvement of the creatinine back down to 1.3, bilirubin is 2.3 today with an AST of 61.  Lipase  is normal.  Patient has a benign abdominal exam.  Given the patient has mild creatinine elevation still slightly over baseline we will IV hydrate the patient.  We obtain a right upper quadrant ultrasound to further evaluate as well given the mild LFT elevation however 1 year ago patient's bilirubin was 1.4 which is not significantly changed.  As long as the ultrasound does not show any significant finding anticipate likely discharge home with continued PCP monitoring.  Patient agreeable to this plan as well.  Patient is feeling better after IV fluids.  Ultrasound shows no significant findings does show some ascites.  Will have the patient follow-up with her doctor.  Patient agreeable to plan.  FINAL CLINICAL IMPRESSION(S) / ED DIAGNOSES   Acute kidney injury Elevated liver function tests   Note:  This document was prepared  using Dragon voice recognition software and may include unintentional dictation errors.   Dorothyann Drivers, MD 07/27/24 2220

## 2024-07-27 NOTE — Discharge Instructions (Signed)
 Please drink plenty fluids over the next Avril days.  Please follow-up with your doctor for recheck of your labs later this week.  Return to the emergency department for any symptom personally concerning to yourself.

## 2024-08-04 ENCOUNTER — Other Ambulatory Visit: Payer: Self-pay | Admitting: Physician Assistant

## 2024-08-04 DIAGNOSIS — K838 Other specified diseases of biliary tract: Secondary | ICD-10-CM

## 2024-08-06 ENCOUNTER — Ambulatory Visit
Admission: RE | Admit: 2024-08-06 | Discharge: 2024-08-06 | Disposition: A | Discharge status: Home / Self Care | Source: Ambulatory Visit | Attending: Physician Assistant | Admitting: Physician Assistant

## 2024-08-06 ENCOUNTER — Other Ambulatory Visit: Payer: Self-pay | Admitting: Physician Assistant

## 2024-08-06 DIAGNOSIS — K838 Other specified diseases of biliary tract: Secondary | ICD-10-CM

## 2024-08-06 MED ORDER — GADOBUTROL 1 MMOL/ML IV SOLN
7.5000 mL | Freq: Once | INTRAVENOUS | Status: AC | PRN
  Administered 2024-08-06: 7.5 mL via INTRAVENOUS

## 2024-08-09 NOTE — Progress Notes (Signed)
 ------------------------------------------------------------------------------- Attestation signed by Marvis Comer Garre, MD at 08/09/24 2019 78 year old woman with a history of hypothyroidism and hypertension who is admitted with pancytopenia, AKI, hyperbilirubinemia and elevated AST and perihepatic and perisplenic ascites. Talking with the patient, she reports poor appetite over the past month, mild dyspnea yesterday that improved with dose of IV lasix 20 last night, 5 lb weight gain and several days of bilateral LE edema (new for her). 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 Her platelets are slightly lower than baseline (70s - low 100s over past few years). Smear is pending. Holding off on Hematology consult but can consider if concerning findings on smear or cell counts worsen. Of note haptoglobin undetectable but this was also noted in 2023. It is unclear to me if she has cirrhosis. She has one CT in October that mentions a mild irregular contour of the liver (normal spleen). Most recent MRCP does not comment. We can certainly see ascites and pancytopenia and elevated bilirubin with cirrhosis. Though there may be other explanations as well such as heart failure (G2DD on echo from 2022) versus pulmonary hypertension (noted to have enlarged pulmonary artery on CT from 2023) with MDS. She does not drink alcohol and workup for cirrhosis is pending (hepatitis labs, autoimmune labs). Although creatinine bumped a little after IV 20 lasix last night, we are diuresing again given ascites, elevated pro-BNP, improvement in dyspnea, pleural effusions on fairly recent imaging and new onset LE edema. Echo is pending. If creatinine worsens with this, I would consider Nephrology consult tomorrow to spin urine. I saw and evaluated the patient, participating in the key portions of the service.  I reviewed the residents note.  I agree with the  residents findings and plan. [name], MD -------------------------------------------------------------------------------  Internal Medicine (MEDW) Progress Note  Assessment & Plan:  Veronica Wright is a 78 y.o. female who is presenting to [facility] with AKI (acute kidney injury), in the setting of the following pertinent/contributing co-morbidities: hepatic steatosis, HTN, HLD, diverticulosis, depression, hypothyroidism, asthma, OA who presented to the ED for evaluation of AKI, elevated LFTs, and ascites.   Principal Problem:   AKI (acute kidney injury) Active Problems:   Asthma (HHS-HCC)   Celiac disease (HHS-HCC)   Depressive disorder   HTN (hypertension)   Gastroesophageal reflux disease   History of transient ischemic attack   Hyperlipidemia  Active Problems  AKI, Etiology Unclear sCr worsened overnight following IV dose of lasix at 2100 from 1.67 on admission to 1.96 on 12/14. Unclear at this time if lasix actually worsened the sCr or if initial source causing delayed/ongoing kidney injury.  Patient complained of shortness of breath on arrival and reported today that after receiving Lasix it feels easier for her to breathe.  This finding (along with pro-BNP of 1000+) would indicate potential pulmonary edema/volume overload that improved with Lasix.  This finding would contradict the belief that IV Lasix dose will worsen the patient's volume down status causing increasing creatinine.  For further assessment, additional dose of IV Lasix will be given today given patient's continued bilateral lower extremity edema to the inferior aspect of both knees.  If patient's serum creatinine continues to rise while being treated with Lasix for edema, will consult nephrology for management recommendations. Additionally, patient is able to void urine well and has not noticed a change from baseline making post-renal obstruction unlikely. Most recent exposure to CT contrast was 10/22, would not  expect intrinsic injury this far removed from contrast exposure. - Consider nephrology consult, would request urine sample for sediment analysis to tube station 91 if consulted - S/p IV lasix 20 (2nd dose 12/14 at 1200, if sCr worsens after dose of lasix, consult Nephro for management recs) - Hold home losartan iso AKI - Trend BMP   Elevated LFTs  Hepatic steatosis  Perihepatic and perisplenic ascites T. bili 2.4, AST/ALT 54/22, ALP 104 on presentation. 06/17/2024 CTAP noted fatty liver with possible early changes of cirrhosis. 08/06/2024 MRI abdomen showed perihepatic and perisplenic ascites with scattered mesenteric edema and small trace bilateral pleural effusions. MELD 23. Findings concerning for possible decompensated cirrhosis of unclear etiology. No history of heavy alcohol use. - Considering Hepatology consult pending further work-up results - Hepatitis panel, HIV, Hep B core and surface Ab, Autoimmune Hepatitis Check (Anti-Smooth Muscle Ab) - Trend CMP  MELD 3.0: 23 at 08/09/2024  7:08 AM MELD-Na: 21 at 08/09/2024  7:08 AM Calculated from: Serum Creatinine: 1.96 mg/dL at 87/85/7974  2:91 AM Serum Sodium: 144 mmol/L (Using max of 137 mmol/L) at 08/09/2024  7:08 AM Total Bilirubin: 2.3 mg/dL at 87/85/7974  2:91 AM Serum Albumin: 2.2 g/dL at 87/85/7974  2:91 AM INR(ratio): 1.5 at 08/09/2024  7:08 AM Age at listing (hypothetical): 63 years Sex: Female at 08/09/2024  7:08 AM  Pancytopenia  Pancytopenia first noted in 02/2022 for which she followed with oncology. Workup at that time including flow cytometry was normal. Int[employer] myeloid panel revealed a mutation of unknown significance, concerning for possible MDS. Decision made with patient to defer BM biopsy at that time. Labs at that time showed WC 2.4, Hgb 10.9, plt 83. WC 3.7, Hgb 9.8, and Plt 57 on presentation.  Iron, ferritin, and iron saturation within normal limits making iron deficiency less likely for anemia.  LDH elevated to  275 and haptoglobin <10 making hemolytic etiology more likely.  B12 elevated and folate deficient; however, MCV within normal limits. - Consider hematology consult - Copper and Zinc - Peripheral smear - HIV, EBV - Maintain active type and screen, transfuse Hgb < 7, plt < 10   Elevated pro-BNP  Shortness of breath  c/f New Onset HF proBNP on admission 1005, patient complaining of shortness of breath on arrival.  This commendation the features along with new onset (3 days ago) bilateral lower extremity edema to the inferior portion of the knees raises concern for new onset heart failure.  Plan to investigate further with updated TTE.  - TTE - Consider Cardiology consult if findings consistent with new onset HF for management recs and establishment of OP follow-up in heart failure clinic - PT/OT  Chronic Problems  HTN Mildly hypertensive on presentation - Hold home losartan 100mg  daily in setting of AKI   Hypothyroidism - Continue home synthroid 75 mcg daily   Depression - Continue home citalopram 20mg  daily - Qtc monitoring    The patient's presentation is complicated by the following clinically significant conditions requiring additional evaluation and treatment: - Thrombocytopenia requiring further investigation or monitor - Chronic kidney disease POA requiring further investigation, treatment, or monitoring  - Age related debility POA requiring additional resources: DME, PT, or OT - Anemia requiring at least daily CBC for further monitoring - Coronary Artery Disease POA requiring further investigation, treatment, or monitoring  Daily Checklist: Diet: Regular Diet DVT PPx: Contraindicated - High Risk for Bleeding/Active Bleeding Electrolytes: No Repletion Needed Code Status: Full Code Dispo: Continue inpatient work-up and treatment  Team Contact  Information:  Primary Team: Internal Medicine (MEDW) Primary Resident: Derrek LITTIE Hamilton, MD, MD Resident's Pager: [phone] (Gen  MedW Intern - Tower)  Interval History:  No acute events overnight. Tolerating PO well without any nausea or vomiting.  This morning, has concerns about what could be causing the SOB and lower extremity edema. She also says she noticed chronic liver and kidney problems listed in her chart but had not be told about any of that In the past. [place] she was SOB on arrival but feels better following lasix 20 dose from yesterday.  All other systems were reviewed and are negative except as noted in the HPI  Objective:  Temp:  [36.3 C (97.3 F)-36.9 C (98.4 F)] 36.9 C (98.4 F) Pulse:  [65-78] 70 SpO2 Pulse:  [67-70] 70 Resp:  [15-23] 23 BP: (91-155)/(60-76) 137/68 SpO2:  [97 %-100 %] 97 %  Gen: Chronically ill-appearing elderly female resting comfortably in bed at 45 degrees, moderately hearing impaired, converses well with linear thought process HENT: atraumatic, normocephalic Heart: NRRR w/ no MRG Lungs: CTAB, no crackles or wheezes Abdomen: moderate tenderness to palpation in LLQ Extremities: scattered ecchymosis on bilateral lower extremities below the knee, 1+ pitting edema to inferior portion of knee bilaterally. No abrasions or wounds on feet.  Signed: Brycen L. Hamilton, MD [facility] General Medicine W Service

## 2024-08-09 NOTE — ED Provider Notes (Signed)
 [facility] Emergency Department Provider Note   History, MDM, ED Course   History: Veronica Wright is a 78 y.o. female with history of diverticulosis, obesity, reported liver and kidney disease who presents to the emergency department after her primary care provider encouraged her to seek further care in the setting of a recent MRI/MRCP of the abdomen that showed new perisplenic and perihepatic ascites.  Patient states she was initially told she may have cirrhosis based on a CT scan that was performed several months ago.  Patient states that she has been following closely with her primary care doctor and that her kidney and liver function have been gradually worsening and with the new MRI imaging she was told to present for further workup/care.  A newer symptom over the past 2 to 3 days includes dyspnea on exertion and bilateral lower extremity swelling.  She denies any associated chest pain, cough, congestion, runny nose, fevers, chills, nausea, vomiting, diarrhea.  She denies any recent medication changes.  Pertinent exam: On examination the patient is afebrile, normal pulse rate, no tachypnea, hemodynamic stable, saturating well on room air.  General appears well in no acute distress.  Normal cardiopulmonary exam.  Abdomen soft, nontender without overt distention.  Patient has 2+ pitting edema to the bilateral lower extremities up to the knees.  No calf tenderness.  Normal distal pulses.  Impression/Ddx: 78 year old female with history and physical exam as above.  Differential diagnosis includes decompensated cirrhosis, acute kidney injury/failure, electrolyte derangements, hypoalbuminemia, new heart failure, less likely ACS/arrhythmia.  Patient has bilateral, nonpainful edema therefore I have a lesser concern for acute DVT.  Will plan to obtain laboratory studies including CBC, CMP, TSH, magnesium, troponin, BNP and urine studies.  Given dyspnea on exertion with lower extremity edema will  plan for chest x-ray to evaluate for pulmonary edema.  Disposition pending completion of workup.   Progress Notes: 1900: Patient was signed out to oncoming provider at this time.  Patient's laboratory evaluation thus far is overall been reassuring other than slight worsening of her kidney function from 1.3-1.4-1.67.  Slight worsening of her T. bili from 2.3-2.4.  AST of 54.  Electrolytes are normal.  She does have hypoalbuminemia of 2.3.  Normal TSH.  CBC with baseline blood counts.  She is currently pending chest x-ray and urinalysis.  Her BNP is somewhat elevated as well to 1000 which may explain her lower extremity edema.  After completion of workup I anticipate admission for further workup of kidney and liver abnormalities.  __________________________________________________________________  The case was discussed with the attending physician who is in agreement with the above assessment and plan.  Additional Medical Decision Making   - Any discussion of this patient's case/presentation between myself and consultants, admitting teams, or other team members has been documented above. - Imaging and other studies, if performed, that were available during my care of the patient were independently reviewed and interpreted by me and considered in my medical decision making as documented above. - External records reviewed: I reviewed the patient's ED provider note from [facility] from 07/27/2024.  Patient had an ultrasound of the right upper quadrant performed at that time that showed findings consistent with history of prior cholecystectomy, hepatic steatosis, ascites and a right sided pleural effusion.  MRI of the abdomen with MRCP performed on 08/06/2024 showed perihepatic and perisplenic ascites with scattered mesenteric edema. - Consideration of admission, observation, transfer, or escalation of care: As discussed above, pending completion of workup  and anticipate admission.     History   Chief Complaint:  Chief Complaint  Patient presents with   Medical Problem    History of Present Illness:  As documented above.  Additional history during this encounter provided by: N/A  Past Medical History: Past Medical History[1]  Medications:   Current Facility-Administered Medications:    acetaminophen  (TYLENOL ) tablet 650 mg, 650 mg, Oral, Q6H PRN, Ahearn, Olivia C, MD   [Provider Hold] citalopram (CeleXA) tablet 20 mg, 20 mg, Oral, Daily, Ahearn, Olivia C, MD   levothyroxine (SYNTHROID) tablet 75 mcg, 75 mcg, Oral, daily, Ahearn, Olivia C, MD, 75 mcg at 08/09/24 0610   melatonin tablet 3 mg, 3 mg, Oral, Nightly PRN, Ahearn, Olivia C, MD   senna (SENOKOT) tablet 2 tablet, 2 tablet, Oral, Nightly PRN, Ahearn, Olivia C, MD  Current Outpatient Medications:    albuterol  HFA 90 mcg/actuation inhaler, every four (4) hours., Disp: , Rfl:    budesonide-formoteroL (SYMBICORT) 160-4.5 mcg/actuation inhaler, Inhale 2 puffs Two (2) times a day. (Patient not taking: Reported on 06/04/2022), Disp: 10.2 g, Rfl: 3   escitalopram oxalate (LEXAPRO) 20 MG tablet, Take 1 tablet (20 mg total) by mouth daily., Disp: , Rfl:    esomeprazole (NEXIUM) 20 MG capsule, Take 1 capsule (20 mg total) by mouth daily before breakfast., Disp: , Rfl:    fluticasone propion-salmeteroL (ADVAIR, WIXELA) 500-50 mcg/dose diskus, Inhale 1 puff Two (2) times a day., Disp: 60 each, Rfl: 3   losartan (COZAAR) 50 MG tablet, Take 1 tablet (50 mg total) by mouth daily., Disp: 90 tablet, Rfl: 3   zolpidem (AMBIEN) 10 mg tablet, Take 1 tablet (10 mg total) by mouth nightly as needed for sleep., Disp: , Rfl:  Patient's Medications  New Prescriptions   No medications on file  Previous Medications   ALBUTEROL  HFA 90 MCG/ACTUATION INHALER    every four (4) hours.   BUDESONIDE-FORMOTEROL (SYMBICORT) 160-4.5 MCG/ACTUATION INHALER    Inhale 2 puffs Two (2) times a day.   ESCITALOPRAM OXALATE (LEXAPRO)  20 MG TABLET    Take 1 tablet (20 mg total) by mouth daily.   ESOMEPRAZOLE (NEXIUM) 20 MG CAPSULE    Take 1 capsule (20 mg total) by mouth daily before breakfast.   FLUTICASONE PROPION-SALMETEROL (ADVAIR, WIXELA) 500-50 MCG/DOSE DISKUS    Inhale 1 puff Two (2) times a day.   LOSARTAN (COZAAR) 50 MG TABLET    Take 1 tablet (50 mg total) by mouth daily.   ZOLPIDEM (AMBIEN) 10 MG TABLET    Take 1 tablet (10 mg total) by mouth nightly as needed for sleep.  Modified Medications   No medications on file  Discontinued Medications   No medications on file    Allergies:  Penicillins and Prednisone   Past Surgical History:  Past Surgical History[2]  Social History:  Social History   Tobacco Use   Smoking status: Never   Smokeless tobacco: Never  Substance Use Topics   Alcohol use: No    Family History: Family History[3]   Physical Exam   Vital Signs:   BP 141/74   Pulse 76   Temp 36.3 C (97.3 F) (Oral)   Resp 19   Wt 83.6 kg (184 lb 4.9 oz)   SpO2 97%   BMI 33.98 kg/m   General: Alert and oriented. Well-appearing and in no distress. Skin: Skin is warm and well-perfused. No rashes noted. HEENT: Normocephalic and atraumatic, moist mucous membranes, no nasal drainage, no intra-oral lesions or erythema. Lungs:  Normal respiratory effort. Breath sounds clear bilaterally without wheezes, crackles, or rhonchi. Heart: Rate as above, regular rhythm. No murmurs appreciated. Abdomen: Soft and non-tender to palpation, non-distended. Extremities: 2+ bilateral lower extremity pitting edema to the knees. Pulses are normal and symmetric in upper and lower extremities. Neurological: Normal speech and language. No gross focal neurologic deficits are appreciated. Psychiatric: Mood and affect are normal. Normal speech and behavior.   Radiology   XR Chest 2 views  Final Result    No acute abnormalities          Echocardiogram W Colorflow Spectral Doppler    (Results Pending)     Labs   Labs Reviewed  COMPREHENSIVE METABOLIC PANEL - Abnormal; Notable for the following components:      Result Value   Creatinine 1.67 (*)    eGFR CKD-EPI (2021) Female 31 (*)    Calcium  8.1 (*)    Albumin 2.3 (*)    Total Bilirubin 2.4 (*)    AST 54 (*)    All other components within normal limits  PRO-BNP - Abnormal; Notable for the following components:   PRO-BNP 1,004.0 (*)    All other components within normal limits   Narrative:    Effective 05/06/23, NT-proBNP replaces BNP, results are not interchangeable. Values less than 300 pg/mL strongly rule out the presence of acute heart failure.  PROTIME-INR - Abnormal; Notable for the following components:   PT 17.0 (*)    All other components within normal limits  COMPREHENSIVE METABOLIC PANEL - Abnormal; Notable for the following components:   Creatinine 1.96 (*)    eGFR CKD-EPI (2021) Female 26 (*)    Calcium  8.1 (*)    Albumin 2.2 (*)    Total Protein 5.5 (*)    Total Bilirubin 2.3 (*)    AST 53 (*)    All other components within normal limits  IRON PANEL - Abnormal; Notable for the following components:   TIBC 208 (*)    All other components within normal limits  LACTATE DEHYDROGENASE - Abnormal; Notable for the following components:   LDH 275 (*)    All other components within normal limits  CBC W/ AUTO DIFF - Abnormal; Notable for the following components:   RBC 3.26 (*)    HGB 9.8 (*)    HCT 28.3 (*)    RDW 16.5 (*)    Platelet 57 (*)    Anisocytosis Slight (*)    All other components within normal limits  URINALYSIS WITH MICROSCOPY WITH CULTURE REFLEX PERFORMABLE - Abnormal; Notable for the following components:   Hyaline Casts, UA 4 (*)    Mucus, UA Rare (*)    All other components within normal limits  CBC W/ AUTO DIFF - Abnormal; Notable for the following components:   RBC 3.35 (*)    HGB 9.9 (*)    HCT 29.1 (*)    RDW 16.9 (*)    Platelet 57 (*)    Absolute Neutrophils 1.7 (*)    Anisocytosis  Slight (*)    All other components within normal limits  RESPIRATORY PATHOGEN PANEL - Normal   Narrative:    This result was obtained using the FDA-cleared BioFire Respiratory 2.1 Panel. Performance characteristics have been established and verified by the Clinical Molecular Microbiology Laboratory, [facility]. This assay does not distinguish between rhinovirus and enterovirus. Lower respiratory specimens will not be tested for Bordetella pertussis/parapertussis. For nasopharyngeal swabs, cross-reactivity may occur between B. pertussis and non-pertussis Bordetella species.  MAGNESIUM - Normal  TSH - Normal  HIGH SENSITIVITY TROPONIN I - SINGLE - Normal  MAGNESIUM - Normal  FERRITIN - Normal   Narrative:    The likelihood of iron deficiency increases with lower ferritin concentrations, including those that overlap with laboratory normal reference intervals:                                  <30     Probable (or likely) iron deficiency                 30-50   Possible iron deficiency                 >50     Iron deficiency is less likely. However, in the setting of inflammation, infection, or in other special populations, ferritin may not reflect physiologic iron stores.  RETICULOCYTES - Normal  PERIPHERAL BLOOD SMEAR, PATH REVIEW - Normal  SEDIMENTATION RATE - Normal  C-REACTIVE PROTEIN - Normal  CBC W/ DIFFERENTIAL   Narrative:    The following orders were created for panel order CBC w/ Differential.                 Procedure                               Abnormality         Status                                    ---------                               -----------         ------                                    CBC w/ Differential[[id number]]         Abnormal            Final result                              Pathologist Smear Review[[id number]]    Normal              Final result                                               Please view results for these tests on the  individual orders.  URINALYSIS WITH MICROSCOPY WITH CULTURE REFLEX   Narrative:    The following orders were created for panel order Urinalysis with Microscopy with Culture Reflex (Clean Catch).                 Procedure                               Abnormality         Status                                    ---------                               -----------         ------  Urinalysis with Microsc.SABRASABRA[[id number]]  Abnormal            Final result                                               Please view results for these tests on the individual orders.  SODIUM, URINE, RANDOM  CREATININE, URINE  CBC W/ DIFFERENTIAL   Narrative:    The following orders were created for panel order CBC w/ Differential.                 Procedure                               Abnormality         Status                                    ---------                               -----------         ------                                    CBC w/ Differential[[id number]]         Abnormal            Final result                                               Please view results for these tests on the individual orders.  FOLATE  VITAMIN B12  HAPTOGLOBIN  HEPATITIS PANEL, ACUTE  HIV ANTIGEN/ANTIBODY COMBO  EBV QUANTITATIVE PCR, BLOOD  COPPER, SERUM  ZINC  TYPE AND SCREEN WITH ABORH CONFIRMATION  ABO/RH  HEMATOPATHOLOGY ORDER   _____________________________________________________________________  Please note - This documentation was generated using dictation and/or voice recognition software, and as such, may contain spelling or other transcription errors. Any questions regarding the content of this documentation should be directed to the individual who electronically signed.      [1] No past medical history on file. [2] No past surgical history on file. [3] No family history on file.  Treasa Donnice SAUNDERS, MD Resident 08/09/24 [phone]

## 2024-08-10 ENCOUNTER — Ambulatory Visit: Admitting: Family Medicine

## 2024-09-10 ENCOUNTER — Telehealth: Payer: Self-pay | Admitting: Oncology

## 2024-09-10 ENCOUNTER — Other Ambulatory Visit: Payer: Self-pay | Admitting: *Deleted

## 2024-09-10 DIAGNOSIS — D61818 Other pancytopenia: Secondary | ICD-10-CM

## 2024-09-10 DIAGNOSIS — D72819 Decreased white blood cell count, unspecified: Secondary | ICD-10-CM

## 2024-09-10 NOTE — Telephone Encounter (Signed)
 Pt calling and states her PCP told her that she has low white blood cells and to give Dr. Jacobo a call so she can schedule an appt.  Please advise on scheduling. [phone]

## 2024-09-22 ENCOUNTER — Other Ambulatory Visit: Payer: Self-pay

## 2024-09-24 ENCOUNTER — Inpatient Hospital Stay

## 2024-09-24 ENCOUNTER — Inpatient Hospital Stay: Admitting: Oncology

## 2024-10-05 ENCOUNTER — Inpatient Hospital Stay: Admitting: Oncology

## 2024-10-05 ENCOUNTER — Inpatient Hospital Stay

## 2024-10-15 ENCOUNTER — Ambulatory Visit: Admitting: Family Medicine
